# Patient Record
Sex: Female | Born: 1955 | Race: Black or African American | Hispanic: No | Marital: Married | Smoking: Never smoker
Health system: Southern US, Community
[De-identification: ages and names within clinical notes are randomized; demographics above are authoritative.]

## PROBLEM LIST (undated history)

## (undated) DIAGNOSIS — Z87442 Personal history of urinary calculi: Secondary | ICD-10-CM

## (undated) DIAGNOSIS — K429 Umbilical hernia without obstruction or gangrene: Secondary | ICD-10-CM

## (undated) DIAGNOSIS — K5790 Diverticulosis of intestine, part unspecified, without perforation or abscess without bleeding: Secondary | ICD-10-CM

## (undated) DIAGNOSIS — N200 Calculus of kidney: Secondary | ICD-10-CM

## (undated) DIAGNOSIS — F329 Major depressive disorder, single episode, unspecified: Secondary | ICD-10-CM

## (undated) DIAGNOSIS — H269 Unspecified cataract: Secondary | ICD-10-CM

## (undated) DIAGNOSIS — H3321 Serous retinal detachment, right eye: Secondary | ICD-10-CM

## (undated) DIAGNOSIS — H401132 Primary open-angle glaucoma, bilateral, moderate stage: Secondary | ICD-10-CM

## (undated) DIAGNOSIS — H409 Unspecified glaucoma: Secondary | ICD-10-CM

## (undated) DIAGNOSIS — R002 Palpitations: Secondary | ICD-10-CM

## (undated) DIAGNOSIS — H35341 Macular cyst, hole, or pseudohole, right eye: Secondary | ICD-10-CM

## (undated) DIAGNOSIS — I10 Essential (primary) hypertension: Secondary | ICD-10-CM

## (undated) DIAGNOSIS — F32A Depression, unspecified: Secondary | ICD-10-CM

## (undated) DIAGNOSIS — R55 Syncope and collapse: Secondary | ICD-10-CM

## (undated) HISTORY — PX: KNEE ARTHROSCOPY: SUR90

## (undated) HISTORY — PX: EYE SURGERY: SHX253

## (undated) HISTORY — PX: COLONOSCOPY: SHX174

## (undated) HISTORY — PX: TUBAL LIGATION: SHX77

## (undated) HISTORY — PX: CATARACT EXTRACTION W/ INTRAOCULAR LENS IMPLANT: SHX1309

## (undated) HISTORY — PX: DILATION AND CURETTAGE OF UTERUS: SHX78

## (undated) HISTORY — PX: CRYOABLATION: SHX1415

## (undated) HISTORY — DX: Calculus of kidney: N20.0

## (undated) HISTORY — PX: SHOULDER ARTHROSCOPY W/ ROTATOR CUFF REPAIR: SHX2400

---

## 2004-02-13 ENCOUNTER — Ambulatory Visit: Payer: Self-pay | Admitting: Family Medicine

## 2004-02-23 ENCOUNTER — Ambulatory Visit: Payer: Self-pay | Admitting: Unknown Physician Specialty

## 2004-09-30 ENCOUNTER — Other Ambulatory Visit: Admission: RE | Admit: 2004-09-30 | Discharge: 2004-09-30 | Payer: Self-pay | Admitting: Gynecology

## 2005-08-26 ENCOUNTER — Ambulatory Visit: Payer: Self-pay | Admitting: Unknown Physician Specialty

## 2005-10-01 ENCOUNTER — Other Ambulatory Visit: Admission: RE | Admit: 2005-10-01 | Discharge: 2005-10-01 | Payer: Self-pay | Admitting: Gynecology

## 2006-02-02 ENCOUNTER — Emergency Department (HOSPITAL_COMMUNITY): Admission: EM | Admit: 2006-02-02 | Discharge: 2006-02-02 | Payer: Self-pay | Admitting: Emergency Medicine

## 2006-11-10 ENCOUNTER — Other Ambulatory Visit: Admission: RE | Admit: 2006-11-10 | Discharge: 2006-11-10 | Payer: Self-pay | Admitting: Gynecology

## 2006-12-30 ENCOUNTER — Ambulatory Visit: Payer: Self-pay | Admitting: Unknown Physician Specialty

## 2007-09-07 ENCOUNTER — Ambulatory Visit: Payer: Self-pay | Admitting: Unknown Physician Specialty

## 2007-11-16 ENCOUNTER — Encounter: Admission: RE | Admit: 2007-11-16 | Discharge: 2007-11-16 | Payer: Self-pay | Admitting: Internal Medicine

## 2007-12-16 ENCOUNTER — Encounter: Admission: RE | Admit: 2007-12-16 | Discharge: 2008-03-02 | Payer: Self-pay | Admitting: Internal Medicine

## 2007-12-31 ENCOUNTER — Ambulatory Visit: Payer: Self-pay | Admitting: Unknown Physician Specialty

## 2008-03-03 HISTORY — PX: ESOPHAGOGASTRODUODENOSCOPY: SHX1529

## 2008-03-03 HISTORY — PX: SHOULDER ARTHROSCOPY W/ ROTATOR CUFF REPAIR: SHX2400

## 2008-03-17 ENCOUNTER — Ambulatory Visit: Payer: Self-pay | Admitting: Unknown Physician Specialty

## 2008-03-22 ENCOUNTER — Ambulatory Visit: Payer: Self-pay | Admitting: Unknown Physician Specialty

## 2008-05-24 ENCOUNTER — Ambulatory Visit: Payer: Self-pay | Admitting: Unknown Physician Specialty

## 2009-01-24 ENCOUNTER — Ambulatory Visit: Payer: Self-pay | Admitting: Unknown Physician Specialty

## 2010-03-03 HISTORY — PX: TUBAL LIGATION: SHX77

## 2010-05-22 ENCOUNTER — Observation Stay: Payer: Self-pay | Admitting: *Deleted

## 2011-04-03 ENCOUNTER — Ambulatory Visit: Payer: Self-pay | Admitting: Unknown Physician Specialty

## 2011-06-13 ENCOUNTER — Ambulatory Visit: Payer: Self-pay | Admitting: Unknown Physician Specialty

## 2011-06-30 ENCOUNTER — Ambulatory Visit: Payer: Self-pay | Admitting: Internal Medicine

## 2011-09-22 ENCOUNTER — Other Ambulatory Visit (HOSPITAL_COMMUNITY)
Admission: RE | Admit: 2011-09-22 | Discharge: 2011-09-22 | Disposition: A | Discharge status: Home / Self Care | Payer: PRIVATE HEALTH INSURANCE | Source: Ambulatory Visit | Attending: Obstetrics and Gynecology | Admitting: Obstetrics and Gynecology

## 2011-09-22 ENCOUNTER — Other Ambulatory Visit: Payer: Self-pay | Admitting: Obstetrics and Gynecology

## 2011-09-22 DIAGNOSIS — Z01419 Encounter for gynecological examination (general) (routine) without abnormal findings: Secondary | ICD-10-CM | POA: Insufficient documentation

## 2011-09-22 DIAGNOSIS — N760 Acute vaginitis: Secondary | ICD-10-CM | POA: Insufficient documentation

## 2012-09-28 ENCOUNTER — Other Ambulatory Visit: Payer: Self-pay | Admitting: Obstetrics and Gynecology

## 2012-09-28 ENCOUNTER — Other Ambulatory Visit (HOSPITAL_COMMUNITY)
Admission: RE | Admit: 2012-09-28 | Discharge: 2012-09-28 | Disposition: A | Discharge status: Home / Self Care | Payer: BC Managed Care – PPO | Source: Ambulatory Visit | Attending: Obstetrics and Gynecology | Admitting: Obstetrics and Gynecology

## 2012-09-28 DIAGNOSIS — Z01419 Encounter for gynecological examination (general) (routine) without abnormal findings: Secondary | ICD-10-CM | POA: Insufficient documentation

## 2012-09-28 DIAGNOSIS — N644 Mastodynia: Secondary | ICD-10-CM

## 2012-09-28 DIAGNOSIS — Z1151 Encounter for screening for human papillomavirus (HPV): Secondary | ICD-10-CM | POA: Insufficient documentation

## 2012-10-12 ENCOUNTER — Other Ambulatory Visit: Payer: Self-pay | Admitting: Obstetrics and Gynecology

## 2012-10-13 ENCOUNTER — Other Ambulatory Visit: Payer: PRIVATE HEALTH INSURANCE

## 2012-10-13 ENCOUNTER — Ambulatory Visit: Payer: Self-pay

## 2012-12-02 ENCOUNTER — Ambulatory Visit: Payer: Self-pay | Admitting: Urology

## 2013-03-03 HISTORY — PX: RETINAL DETACHMENT SURGERY: SHX105

## 2013-04-10 ENCOUNTER — Other Ambulatory Visit: Payer: Self-pay | Admitting: Ophthalmology

## 2013-04-10 MED ORDER — TETRACAINE HCL 0.5 % OP SOLN: 1.0000 [drp] | OPHTHALMIC | Status: DC

## 2013-04-10 NOTE — H&P (Signed)
History & Physical:   DATE:   04-07-13  NAME:  Zoe [name], Zoe [name]     [id number]       HISTORY OF PRESENT ILLNESS: Chief Eye Complaints   Glaucoma  patient  : Patient is having trouble seeing @ Dist vision is cloudy and blurry   patient notes difficulty with car lights while driving at night in particular.,Patient states that she is ready for Cat, Surgery    HPI: EYES: Reports symptoms of OD cloudy and Blurry             ACTIVE PROBLEMS: Visually significant cataract, right eye Primary open angle glaucoma   ICD#365.11  Onset:   Initial Date:    treatment.  Visual Fields  progression noted OD, stable OS  Nuclear cataract NOS   ICD#366.04  Onset:   Initial Date:  SURGERIES:   SLT 06-23-2012 OD nasal SLT 09-09-12 OD TEMP  MEDICATIONS: Travatan Z: Strength-  SIG-  1 gtt in each affected eye once a day (in the evening) for 30 days  Combigan: 0.2%-0.5% solution SIG-  1 gtt in each affected eye every 12 hours for 30 days 1 drop eqch eye twice daily   6 am  REVIEW OF SYSTEMS: ROS:   GEN- Constitutional: negative except as noted HENT: poor vision  GEN - Endocrine: Reports symptoms of LUNGS/Respiratory:  HEART/Cardiovascular: Reports symptoms of hypertension ABD/Gastrointestinal:   Musculoskeletal (BJE): NEURO/Neurological: PSYCH/Psychiatric:    Is the pt oriented to time, place, person? yes  Mood normal    TOBACCO:  Never smoker   ICD#V13.89    SOCIAL HISTORY  works [employer]   FAMILY HISTORY: Positive family history for  -   Glaucoma:  Glaucoma/Diabetes/HTN Family History - 1st Degree Relatives:  Mother alive and well.  Father alive and well.  ALLERGIES:  Drug Allergies.  No Known.   PHYSICAL EXAMINATION: VS: BMI: 23.7.  BP: 145/83.  H: 65.00 in.  P: 66 /min.  RR: 20 /min.  W: 142lbs 0oz.    Va     OD:cc 20/40+ PH:20/NI OS:cc 20/20-  EYEGLASSES:  OD:-7.50 + 1.00 x 018                                          OS:-4.00 + 0.75 x  062 ADD:+2.00  MR02/07/2013 12:22    OD -7.25 +0.50 x 024    20/40  OS  -4.00 +0.75 x 024    20/25+ ADD  K's02/07/2013 11:54  OD:43.25 44.50 44.00 OS:43.25 45.25 44.25   VF:  OD:inf arcuate progressed                                            OS: arcuate defect   Motility :orthophoria and full  PUPILS: 3 mm round reactive negative Marcus Gunn  EYELIDS & OCULAR ADNEXA :normal each eye  SLE: Conjunctiva:trace injection each eye  Cornea: decrease tear film each eye and arcus   Anterior Chamber:  deep and quiet each eye  Iris:Brown each eye  Lens: OD:+ 3 nuclear sclerosis  OS:1-2 nuclear sclerosis   Ta   in mmHg    OD: 19       OS:20  Time:04/07/2013 12:54   Dilation:  phenylephrine 2.5%    Fundus: optic nerve   OD:75-80%   cup pink color                                               OS:pink color 55-60% cup Macula:       OD:   CLEAR                                                  OS:  CLEAR Vessels:  normal    Periphery:  normal    Ocular coherent tomography OD reveals a very thin diffusely nerve fiber layer, left eye also reveals a thin nerve fiber layer but low signal strength OS decreases reliability OS    Exam: GENERAL: Appearance: General appearance can be described as well-nourished, well-developed, and in no acute distress.    HEAD, EARS, NOSE AND THROAT: Ears-Nose (external) Inspection: Externally, nose and ears are normal in appearance and without scars, lesions, or nodules.      Hearing assessment shows no problems with normal conversation.     NECK: Neck tissue exam demonstrates no masses, symmetrical, and trachea is midline.      LUNGS and RESPIRATORY: Lung auscultation elicits no wheezing, rhonci, rales or rubs and with equal breath sounds.    Respiratory effort described as breathing is unlabored and chest movement is symmetrical.    HEART (Cardiovascular): Heart auscultation discovers regular rate and rhythm; no murmur, gallop or rub. Normal  heart sounds.    ABDOMEN (Gastrointestinal): Mass/Tenderness Exam: Neither are present.     MUSCULOSKELETAL (BJE): Inspection-Palpation: No major bone, joint, tendon, or muscle changes.      NEUROLOGICAL: Alert and oriented. No major deficits of coordination or sensation.      PSYCHIATRIC: Insight and judgment appear  both to be intact and appropriate.    Mood and affect are described as normal mood and full affect.    SKIN: Skin Inspection: No rashes or lesions  ADMITTING DIAGNOSIS: Visually significant cataract, right eye Primary open angle glaucoma   ICD#365.11  Onset:    treatment.  Visual Fields  progression noted OD, stable OS  Nuclear cataract NOS   ICD#366.04  Onset:  SURGICAL TREATMENT PLAN: phaco emulsion cataract extraction  emulsification with intraocular lens implant OD  Risk and benefits of surgery have been reviewed with the patient and the patient agrees to proceed with the surgical procedure.   We discussed astigmatism we discussed glaucoma control before and after cataract surgery.  The patient's glaucoma is not adequately controlled.  She understands that she might require glaucoma surgery.  The current operation will be for cataract surgery and visual improvement.    ___________________________ Vang Kraeger, Jr. Starter - Inactive Problems:  

## 2013-04-10 NOTE — H&P (Signed)
History & Physical:   DATE:   04-07-13  NAME:  Zoe [name], Zoe [name]     [id number]       HISTORY OF PRESENT ILLNESS: Chief Eye Complaints   Glaucoma  patient  : Patient is having trouble seeing @ Dist vision is cloudy and blurry   patient notes difficulty with car lights while driving at night in particular.,Patient states that she is ready for Cat, Surgery    HPI: EYES: Reports symptoms of OD cloudy and Blurry             ACTIVE PROBLEMS: Visually significant cataract, right eye Primary open angle glaucoma   ICD#365.11  Onset:   Initial Date:    treatment.  Visual Fields  progression noted OD, stable OS  Nuclear cataract NOS   ICD#366.04  Onset:   Initial Date:  SURGERIES:   SLT 06-23-2012 OD nasal SLT 09-09-12 OD TEMP  MEDICATIONS: Travatan Z: Strength-  SIG-  1 gtt in each affected eye once a day (in the evening) for 30 days  Combigan: 0.2%-0.5% solution SIG-  1 gtt in each affected eye every 12 hours for 30 days 1 drop eqch eye twice daily   6 am  REVIEW OF SYSTEMS: ROS:   GEN- Constitutional: negative except as noted HENT: poor vision  GEN - Endocrine: Reports symptoms of LUNGS/Respiratory:  HEART/Cardiovascular: Reports symptoms of hypertension ABD/Gastrointestinal:   Musculoskeletal (BJE): NEURO/Neurological: PSYCH/Psychiatric:    Is the pt oriented to time, place, person? yes  Mood normal    TOBACCO:  Never smoker   ICD#V13.89    SOCIAL HISTORY  works [employer]   FAMILY HISTORY: Positive family history for  -   Glaucoma:  Glaucoma/Diabetes/HTN Family History - 1st Degree Relatives:  Mother alive and well.  Father alive and well.  ALLERGIES:  Drug Allergies.  No Known.   PHYSICAL EXAMINATION: VS: BMI: 23.7.  BP: 145/83.  H: 65.00 in.  P: 66 /min.  RR: 20 /min.  W: 142lbs 0oz.    Va     OD:cc 20/40+ PH:20/NI OS:cc 20/20-  EYEGLASSES:  OD:-7.50 + 1.00 x 018                                          OS:-4.00 + 0.75 x  062 ADD:+2.00  MR02/07/2013 12:22    OD -7.25 +0.50 x 024    20/40  OS  -4.00 +0.75 x 024    20/25+ ADD  K's02/07/2013 11:54  OD:43.25 44.50 44.00 OS:43.25 45.25 44.25   VF:  OD:inf arcuate progressed                                            OS: arcuate defect   Motility :orthophoria and full  PUPILS: 3 mm round reactive negative Marcus Gunn  EYELIDS & OCULAR ADNEXA :normal each eye  SLE: Conjunctiva:trace injection each eye  Cornea: decrease tear film each eye and arcus   Anterior Chamber:  deep and quiet each eye  Iris:Brown each eye  Lens: OD:+ 3 nuclear sclerosis  OS:1-2 nuclear sclerosis   Ta   in mmHg    OD: 19       OS:20  Time:04/07/2013 12:54   Dilation:  phenylephrine 2.5%    Fundus: optic nerve   OD:75-80%  cup pink color                                               OS:pink color 55-60% cup Macula:       OD:   CLEAR                                                  OS:  CLEAR Vessels:  normal    Periphery:  normal    Ocular coherent tomography OD reveals a very thin diffusely nerve fiber layer, left eye also reveals a thin nerve fiber layer but low signal strength OS decreases reliability OS    Exam: GENERAL: Appearance: General appearance can be described as well-nourished, well-developed, and in no acute distress.    HEAD, EARS, NOSE AND THROAT: Ears-Nose (external) Inspection: Externally, nose and ears are normal in appearance and without scars, lesions, or nodules.      Hearing assessment shows no problems with normal conversation.     NECK: Neck tissue exam demonstrates no masses, symmetrical, and trachea is midline.      LUNGS and RESPIRATORY: Lung auscultation elicits no wheezing, rhonci, rales or rubs and with equal breath sounds.    Respiratory effort described as breathing is unlabored and chest movement is symmetrical.    HEART (Cardiovascular): Heart auscultation discovers regular rate and rhythm; no murmur, gallop or rub. Normal  heart sounds.    ABDOMEN (Gastrointestinal): Mass/Tenderness Exam: Neither are present.     MUSCULOSKELETAL (BJE): Inspection-Palpation: No major bone, joint, tendon, or muscle changes.      NEUROLOGICAL: Alert and oriented. No major deficits of coordination or sensation.      PSYCHIATRIC: Insight and judgment appear  both to be intact and appropriate.    Mood and affect are described as normal mood and full affect.    SKIN: Skin Inspection: No rashes or lesions  ADMITTING DIAGNOSIS: Visually significant cataract, right eye Primary open angle glaucoma   ICD#365.11  Onset:    treatment.  Visual Fields  progression noted OD, stable OS  Nuclear cataract NOS   ICD#366.04  Onset:  SURGICAL TREATMENT PLAN: phaco emulsion cataract extraction  emulsification with intraocular lens implant OD  Risk and benefits of surgery have been reviewed with the patient and the patient agrees to proceed with the surgical procedure.   We discussed astigmatism we discussed glaucoma control before and after cataract surgery.  The patient's glaucoma is not adequately controlled.  She understands that she might require glaucoma surgery.  The current operation will be for cataract surgery and visual improvement.    ___________________________ Nakeitha Milligan, Jr. Starter - Inactive Problems:  

## 2013-04-10 NOTE — H&P (Signed)
History & Physical:   DATE:   04-07-13  NAME:  Zoe [name]     [id number]       HISTORY OF PRESENT ILLNESS: Chief Eye Complaints   Glaucoma  patient  : Patient is having trouble seeing @ Dist vision is cloudy and blurry   patient notes difficulty with car lights while driving at night in particular.,Patient states that she is ready for Cat, Surgery    HPI: EYES: Reports symptoms of OD cloudy and Blurry             ACTIVE PROBLEMS: Visually significant cataract, right eye Primary open angle glaucoma   ICD#365.11  Onset:   Initial Date:    treatment.  Visual Fields  progression noted OD, stable OS  Nuclear cataract NOS   ICD#366.04  Onset:   Initial Date:  SURGERIES:   SLT 06-23-2012 OD nasal SLT 09-09-12 OD TEMP  MEDICATIONS: Travatan Z: Strength-  SIG-  1 gtt in each affected eye once a day (in the evening) for 30 days  Combigan: 0.2%-0.5% solution SIG-  1 gtt in each affected eye every 12 hours for 30 days 1 drop eqch eye twice daily   6 am  REVIEW OF SYSTEMS: ROS:   GEN- Constitutional: negative except as noted HENT: poor vision  GEN - Endocrine: Reports symptoms of LUNGS/Respiratory:  HEART/Cardiovascular: Reports symptoms of hypertension ABD/Gastrointestinal:   Musculoskeletal (BJE): NEURO/Neurological: PSYCH/Psychiatric:    Is the pt oriented to time, place, person? yes  Mood normal    TOBACCO:  Never smoker   ICD#V13.89    SOCIAL HISTORY  works [employer]   FAMILY HISTORY: Positive family history for  -   Glaucoma:  Glaucoma/Diabetes/HTN Family History - 1st Degree Relatives:  Mother alive and well.  Father alive and well.  ALLERGIES:  Drug Allergies.  No Known.   PHYSICAL EXAMINATION: VS: BMI: 23.7.  BP: 145/83.  H: 65.00 in.  P: 66 /min.  RR: 20 /min.  W: 142lbs 0oz.    Va     OD:cc 20/40+ PH:20/NI OS:cc 20/20-  EYEGLASSES:  OD:-7.50 + 1.00 x 018                                          OS:-4.00 + 0.75 x  062 ADD:+2.00  MR02/07/2013 12:22    OD -7.25 +0.50 x 024    20/40  OS  -4.00 +0.75 x 024    20/25+ ADD  K's02/07/2013 11:54  OD:43.25 44.50 44.00 OS:43.25 45.25 44.25   VF:  OD:inf arcuate progressed                                            OS: arcuate defect   Motility :orthophoria and full  PUPILS: 3 mm round reactive negative Marcus Gunn  EYELIDS & OCULAR ADNEXA :normal each eye  SLE: Conjunctiva:trace injection each eye  Cornea: decrease tear film each eye and arcus   Anterior Chamber:  deep and quiet each eye  Iris:Brown each eye  Lens: OD:+ 3 nuclear sclerosis  OS:1-2 nuclear sclerosis   Ta   in mmHg    OD: 19       OS:20  Time:04/07/2013 12:54   Dilation:  phenylephrine 2.5%    Fundus: optic nerve   OD:75-80%   cup pink color                                               OS:pink color 55-60% cup Macula:       OD:   CLEAR                                                  OS:  CLEAR Vessels:  normal    Periphery:  normal    Ocular coherent tomography OD reveals a very thin diffusely nerve fiber layer, left eye also reveals a thin nerve fiber layer but low signal strength OS decreases reliability OS    Exam: GENERAL: Appearance: General appearance can be described as well-nourished, well-developed, and in no acute distress.    HEAD, EARS, NOSE AND THROAT: Ears-Nose (external) Inspection: Externally, nose and ears are normal in appearance and without scars, lesions, or nodules.      Hearing assessment shows no problems with normal conversation.     NECK: Neck tissue exam demonstrates no masses, symmetrical, and trachea is midline.      LUNGS and RESPIRATORY: Lung auscultation elicits no wheezing, rhonci, rales or rubs and with equal breath sounds.    Respiratory effort described as breathing is unlabored and chest movement is symmetrical.    HEART (Cardiovascular): Heart auscultation discovers regular rate and rhythm; no murmur, gallop or rub. Normal  heart sounds.    ABDOMEN (Gastrointestinal): Mass/Tenderness Exam: Neither are present.     MUSCULOSKELETAL (BJE): Inspection-Palpation: No major bone, joint, tendon, or muscle changes.      NEUROLOGICAL: Alert and oriented. No major deficits of coordination or sensation.      PSYCHIATRIC: Insight and judgment appear  both to be intact and appropriate.    Mood and affect are described as normal mood and full affect.    SKIN: Skin Inspection: No rashes or lesions  ADMITTING DIAGNOSIS: Visually significant cataract, right eye Primary open angle glaucoma   ICD#365.11  Onset:    treatment.  Visual Fields  progression noted OD, stable OS  Nuclear cataract NOS   ICD#366.04  Onset:  SURGICAL TREATMENT PLAN: phaco emulsion cataract extraction  emulsification with intraocular lens implant OD  Risk and benefits of surgery have been reviewed with the patient and the patient agrees to proceed with the surgical procedure.   We discussed astigmatism we discussed glaucoma control before and after cataract surgery.  The patient's glaucoma is not adequately controlled.  She understands that she might require glaucoma surgery.  The current operation will be for cataract surgery and visual improvement.    ___________________________ Bryn Saline, Jr. Starter - Inactive Problems:  

## 2013-04-11 ENCOUNTER — Encounter (HOSPITAL_COMMUNITY): Payer: Self-pay | Admitting: Pharmacy Technician

## 2013-04-12 ENCOUNTER — Inpatient Hospital Stay (HOSPITAL_COMMUNITY): Admission: RE | Admit: 2013-04-12 | Payer: PRIVATE HEALTH INSURANCE | Source: Ambulatory Visit

## 2013-04-12 ENCOUNTER — Encounter (HOSPITAL_COMMUNITY): Payer: Self-pay

## 2013-04-12 ENCOUNTER — Encounter (HOSPITAL_COMMUNITY)
Admission: RE | Admit: 2013-04-12 | Discharge: 2013-04-12 | Disposition: A | Discharge status: Home / Self Care | Payer: BC Managed Care – PPO | Source: Ambulatory Visit | Attending: Ophthalmology | Admitting: Ophthalmology

## 2013-04-12 ENCOUNTER — Ambulatory Visit (HOSPITAL_COMMUNITY)
Admission: RE | Admit: 2013-04-12 | Discharge: 2013-04-12 | Disposition: A | Discharge status: Home / Self Care | Payer: BC Managed Care – PPO | Source: Ambulatory Visit | Attending: Anesthesiology | Admitting: Anesthesiology

## 2013-04-12 HISTORY — DX: Depression, unspecified: F32.A

## 2013-04-12 HISTORY — DX: Calculus of kidney: N20.0

## 2013-04-12 HISTORY — DX: Unspecified glaucoma: H40.9

## 2013-04-12 HISTORY — DX: Unspecified cataract: H26.9

## 2013-04-12 HISTORY — DX: Major depressive disorder, single episode, unspecified: F32.9

## 2013-04-12 HISTORY — DX: Umbilical hernia without obstruction or gangrene: K42.9

## 2013-04-12 HISTORY — DX: Essential (primary) hypertension: I10

## 2013-04-12 HISTORY — DX: Syncope and collapse: R55

## 2013-04-12 LAB — CBC
HEMATOCRIT: 37.6 % (ref 36.0–46.0)
Hemoglobin: 12.5 g/dL (ref 12.0–15.0)
MCH: 30.3 pg (ref 26.0–34.0)
MCHC: 33.2 g/dL (ref 30.0–36.0)
MCV: 91 fL (ref 78.0–100.0)
Platelets: 239 10*3/uL (ref 150–400)
RBC: 4.13 MIL/uL (ref 3.87–5.11)
RDW: 12.4 % (ref 11.5–15.5)
WBC: 7.4 10*3/uL (ref 4.0–10.5)

## 2013-04-12 LAB — BASIC METABOLIC PANEL
BUN: 15 mg/dL (ref 6–23)
CHLORIDE: 107 meq/L (ref 96–112)
CO2: 28 meq/L (ref 19–32)
Calcium: 8.7 mg/dL (ref 8.4–10.5)
Creatinine, Ser: 0.93 mg/dL (ref 0.50–1.10)
GFR calc non Af Amer: 67 mL/min — ABNORMAL LOW (ref >90)
GFR, EST AFRICAN AMERICAN: 78 mL/min — AB (ref >90)
Glucose, Bld: 117 mg/dL — ABNORMAL HIGH (ref 70–99)
POTASSIUM: 3.8 meq/L (ref 3.7–5.3)
SODIUM: 145 meq/L (ref 137–147)

## 2013-04-12 MED ORDER — GATIFLOXACIN 0.5 % OP SOLN: 1.0000 [drp] | OPHTHALMIC | Status: AC

## 2013-04-12 MED ORDER — CYCLOPENTOLATE HCL 1 % OP SOLN
1.0000 [drp] | OPHTHALMIC | Status: AC
  Administered 2013-04-13 (×3): 1 [drp] via OPHTHALMIC
  Filled 2013-04-12: qty 2

## 2013-04-12 MED ORDER — PHENYLEPHRINE HCL 2.5 % OP SOLN
1.0000 [drp] | OPHTHALMIC | Status: AC
  Administered 2013-04-13 (×3): 1 [drp] via OPHTHALMIC
  Filled 2013-04-12: qty 15

## 2013-04-12 MED ORDER — KETOROLAC TROMETHAMINE 0.5 % OP SOLN
1.0000 [drp] | OPHTHALMIC | Status: AC
  Administered 2013-04-13: 1 [drp] via OPHTHALMIC
  Filled 2013-04-12: qty 5

## 2013-04-12 MED ORDER — TROPICAMIDE 1 % OP SOLN
1.0000 [drp] | OPHTHALMIC | Status: AC
  Administered 2013-04-13 (×3): 1 [drp] via OPHTHALMIC
  Filled 2013-04-12: qty 3

## 2013-04-12 NOTE — Pre-Procedure Instructions (Signed)
Zoe [name]  04/12/2013   Your procedure is scheduled on:  Wednesday, April 13, 2013 at 8:30 AM  Report to [facility] Short Stay (use Main Entrance "A'') at 6:30 AM.  Call this number if you have problems the morning of surgery: [phone]   Remember:   Do not eat food or drink liquids after midnight.   Take these medicines the morning of surgery with A SIP OF WATER: If needed:acetaminophen (TYLENOL) 500 MG tablet for mild pain   Do not wear jewelry, make-up or nail polish.  Do not wear lotions, powders, or perfumes. You may wear deodorant.  Do not shave 48 hours prior to surgery. Men may shave face and neck.  Do not bring valuables to the hospital.  [facility] is not responsible for any belongings or valuables.               Contacts, dentures or bridgework may not be worn into surgery.  Leave suitcase in the car. After surgery it may be brought to your room.  For patients admitted to the hospital, discharge time is determined by your treatment team.               Patients discharged the day of surgery will not be allowed to drive home.  Name and phone number of your driver:   Special Instructions:  Special Instructions:Special Instructions: [facility] - Preparing for Surgery  Before surgery, you can play an important role.  Because skin is not sterile, your skin needs to be as free of germs as possible.  You can reduce the number of germs on you skin by washing with CHG (chlorahexidine gluconate) soap before surgery.  CHG is an antiseptic cleaner which kills germs and bonds with the skin to continue killing germs even after washing.  Please DO NOT use if you have an allergy to CHG or antibacterial soaps.  If your skin becomes reddened/irritated stop using the CHG and inform your nurse when you arrive at Short Stay.  Do not shave (including legs and underarms) for at least 48 hours prior to the first CHG shower.  You may shave your face.  Please follow these instructions  carefully:   1.  Shower with CHG Soap the night before surgery and the morning of Surgery.  2.  If you choose to wash your hair, wash your hair first as usual with your normal shampoo.  3.  After you shampoo, rinse your hair and body thoroughly to remove the Shampoo.  4.  Use CHG as you would any other liquid soap.  You can apply chg directly  to the skin and wash gently with scrungie or a clean washcloth.  5.  Apply the CHG Soap to your body ONLY FROM THE NECK DOWN.  Do not use on open wounds or open sores.  Avoid contact with your eyes, ears, mouth and genitals (private parts).  Wash genitals (private parts) with your normal soap.  6.  Wash thoroughly, paying special attention to the area where your surgery will be performed.  7.  Thoroughly rinse your body with warm water from the neck down.  8.  DO NOT shower/wash with your normal soap after using and rinsing off the CHG Soap.  9.  Pat yourself dry with a clean towel.            10.  Wear clean pajamas.            11.  Place clean sheets on your bed  the night of your first shower and do not sleep with pets.  Day of Surgery  Do not apply any lotions/deodorants the morning of surgery.  Please wear clean clothes to the hospital/surgery center.   Please read over the following fact sheets that you were given: Pain Booklet and Surgical Site Infection Prevention

## 2013-04-13 ENCOUNTER — Ambulatory Visit (HOSPITAL_COMMUNITY): Payer: BC Managed Care – PPO | Admitting: Certified Registered Nurse Anesthetist

## 2013-04-13 ENCOUNTER — Encounter (HOSPITAL_COMMUNITY): Payer: BC Managed Care – PPO | Admitting: Certified Registered Nurse Anesthetist

## 2013-04-13 ENCOUNTER — Encounter (HOSPITAL_COMMUNITY)
Admission: RE | Disposition: A | Discharge status: Home / Self Care | Payer: Self-pay | Source: Ambulatory Visit | Attending: Ophthalmology

## 2013-04-13 ENCOUNTER — Encounter (HOSPITAL_COMMUNITY): Payer: Self-pay | Admitting: *Deleted

## 2013-04-13 ENCOUNTER — Ambulatory Visit (HOSPITAL_COMMUNITY)
Admission: RE | Admit: 2013-04-13 | Discharge: 2013-04-13 | Disposition: A | Discharge status: Home / Self Care | Payer: BC Managed Care – PPO | Source: Ambulatory Visit | Attending: Ophthalmology | Admitting: Ophthalmology

## 2013-04-13 DIAGNOSIS — F329 Major depressive disorder, single episode, unspecified: Secondary | ICD-10-CM | POA: Insufficient documentation

## 2013-04-13 DIAGNOSIS — Z01818 Encounter for other preprocedural examination: Secondary | ICD-10-CM | POA: Insufficient documentation

## 2013-04-13 DIAGNOSIS — F3289 Other specified depressive episodes: Secondary | ICD-10-CM | POA: Insufficient documentation

## 2013-04-13 DIAGNOSIS — H409 Unspecified glaucoma: Secondary | ICD-10-CM | POA: Insufficient documentation

## 2013-04-13 DIAGNOSIS — H269 Unspecified cataract: Secondary | ICD-10-CM | POA: Insufficient documentation

## 2013-04-13 DIAGNOSIS — I10 Essential (primary) hypertension: Secondary | ICD-10-CM | POA: Insufficient documentation

## 2013-04-13 DIAGNOSIS — Z01810 Encounter for preprocedural cardiovascular examination: Secondary | ICD-10-CM | POA: Insufficient documentation

## 2013-04-13 DIAGNOSIS — H4011X0 Primary open-angle glaucoma, stage unspecified: Secondary | ICD-10-CM | POA: Insufficient documentation

## 2013-04-13 DIAGNOSIS — Z01812 Encounter for preprocedural laboratory examination: Secondary | ICD-10-CM | POA: Insufficient documentation

## 2013-04-13 HISTORY — PX: CATARACT EXTRACTION W/PHACO: SHX586

## 2013-04-13 SURGERY — PHACOEMULSIFICATION, CATARACT, WITH IOL INSERTION
Anesthesia: Monitor Anesthesia Care | Site: Eye | Laterality: Right

## 2013-04-13 MED ORDER — DEXAMETHASONE SODIUM PHOSPHATE 10 MG/ML IJ SOLN
INTRAMUSCULAR | Status: AC
  Filled 2013-04-13: qty 1

## 2013-04-13 MED ORDER — ARTIFICIAL TEARS OP OINT
TOPICAL_OINTMENT | OPHTHALMIC | Status: DC | PRN
  Administered 2013-04-13: 1 via OPHTHALMIC

## 2013-04-13 MED ORDER — LIDOCAINE HCL 2 % IJ SOLN
INTRAMUSCULAR | Status: AC
  Filled 2013-04-13: qty 20

## 2013-04-13 MED ORDER — EPINEPHRINE HCL 1 MG/ML IJ SOLN
INTRAMUSCULAR | Status: AC
  Filled 2013-04-13: qty 1

## 2013-04-13 MED ORDER — MIDAZOLAM HCL 2 MG/2ML IJ SOLN
INTRAMUSCULAR | Status: AC
  Filled 2013-04-13: qty 2

## 2013-04-13 MED ORDER — LIDOCAINE-EPINEPHRINE 2 %-1:100000 IJ SOLN
INTRAMUSCULAR | Status: DC | PRN
Start: 2013-04-13 — End: 2013-04-13
  Administered 2013-04-13: 09:00:00 via RETROBULBAR

## 2013-04-13 MED ORDER — NA CHONDROIT SULF-NA HYALURON 40-30 MG/ML IO SOLN
INTRAOCULAR | Status: DC | PRN
  Administered 2013-04-13: 0.5 mL via INTRAOCULAR

## 2013-04-13 MED ORDER — ACETYLCHOLINE CHLORIDE 1:100 IO SOLR
INTRAOCULAR | Status: AC
  Filled 2013-04-13: qty 1

## 2013-04-13 MED ORDER — SODIUM CHLORIDE 0.9 % IV SOLN
INTRAVENOUS | Status: DC | PRN
  Administered 2013-04-13 (×2): via INTRAVENOUS

## 2013-04-13 MED ORDER — BUPIVACAINE HCL (PF) 0.75 % IJ SOLN
INTRAMUSCULAR | Status: AC
  Filled 2013-04-13: qty 10

## 2013-04-13 MED ORDER — FENTANYL CITRATE 0.05 MG/ML IJ SOLN
INTRAMUSCULAR | Status: AC
  Filled 2013-04-13: qty 5

## 2013-04-13 MED ORDER — LIDOCAINE-EPINEPHRINE 2 %-1:100000 IJ SOLN
INTRAMUSCULAR | Status: AC
  Filled 2013-04-13: qty 1

## 2013-04-13 MED ORDER — GENTAMICIN SULFATE 40 MG/ML IJ SOLN
INTRAMUSCULAR | Status: AC
  Filled 2013-04-13: qty 2

## 2013-04-13 MED ORDER — PROPOFOL 10 MG/ML IV BOLUS
INTRAVENOUS | Status: AC
  Filled 2013-04-13: qty 20

## 2013-04-13 MED ORDER — BSS IO SOLN
INTRAOCULAR | Status: AC
  Filled 2013-04-13: qty 500

## 2013-04-13 MED ORDER — NA CHONDROIT SULF-NA HYALURON 40-30 MG/ML IO SOLN
INTRAOCULAR | Status: AC
  Filled 2013-04-13: qty 0.5

## 2013-04-13 MED ORDER — MIDAZOLAM HCL 5 MG/5ML IJ SOLN
INTRAMUSCULAR | Status: DC | PRN
  Administered 2013-04-13: 2 mg via INTRAVENOUS

## 2013-04-13 MED ORDER — ONDANSETRON HCL 4 MG/2ML IJ SOLN
INTRAMUSCULAR | Status: AC
  Filled 2013-04-13: qty 2

## 2013-04-13 MED ORDER — FENTANYL CITRATE 0.05 MG/ML IJ SOLN
INTRAMUSCULAR | Status: DC | PRN
  Administered 2013-04-13 (×2): 50 ug via INTRAVENOUS

## 2013-04-13 MED ORDER — PILOCARPINE HCL 4 % OP SOLN
OPHTHALMIC | Status: AC
  Filled 2013-04-13: qty 15

## 2013-04-13 MED ORDER — BSS IO SOLN
INTRAOCULAR | Status: DC | PRN
  Administered 2013-04-13 (×2)

## 2013-04-13 MED ORDER — BSS IO SOLN
INTRAOCULAR | Status: AC
  Filled 2013-04-13: qty 15

## 2013-04-13 MED ORDER — PROPOFOL 10 MG/ML IV BOLUS
INTRAVENOUS | Status: DC | PRN
  Administered 2013-04-13 (×2): 20 mg via INTRAVENOUS

## 2013-04-13 MED ORDER — SUCCINYLCHOLINE CHLORIDE 20 MG/ML IJ SOLN
INTRAMUSCULAR | Status: AC
  Filled 2013-04-13: qty 1

## 2013-04-13 MED ORDER — HYALURONIDASE HUMAN 150 UNIT/ML IJ SOLN
INTRAMUSCULAR | Status: AC
  Filled 2013-04-13: qty 1

## 2013-04-13 MED ORDER — ROCURONIUM BROMIDE 50 MG/5ML IV SOLN
INTRAVENOUS | Status: AC
  Filled 2013-04-13: qty 1

## 2013-04-13 MED ORDER — ARTIFICIAL TEARS OP OINT
TOPICAL_OINTMENT | OPHTHALMIC | Status: AC
  Filled 2013-04-13: qty 3.5

## 2013-04-13 MED ORDER — SODIUM HYALURONATE 10 MG/ML IO SOLN
INTRAOCULAR | Status: DC | PRN
  Administered 2013-04-13 (×2): 0.85 mL via INTRAOCULAR

## 2013-04-13 MED ORDER — TOBRAMYCIN-DEXAMETHASONE 0.3-0.1 % OP OINT
TOPICAL_OINTMENT | OPHTHALMIC | Status: AC
  Filled 2013-04-13: qty 3.5

## 2013-04-13 MED ORDER — SODIUM HYALURONATE 10 MG/ML IO SOLN
INTRAOCULAR | Status: AC
  Filled 2013-04-13: qty 0.85

## 2013-04-13 MED ORDER — TOBRAMYCIN 0.3 % OP OINT
TOPICAL_OINTMENT | OPHTHALMIC | Status: DC | PRN
  Administered 2013-04-13: 1 via OPHTHALMIC

## 2013-04-13 MED ORDER — ACETYLCHOLINE CHLORIDE 1:100 IO SOLR
INTRAOCULAR | Status: DC | PRN
  Administered 2013-04-13: 10 mg via INTRAOCULAR

## 2013-04-13 MED ORDER — ONDANSETRON HCL 4 MG/2ML IJ SOLN
INTRAMUSCULAR | Status: DC | PRN
  Administered 2013-04-13: 4 mg via INTRAVENOUS

## 2013-04-13 MED ORDER — TETRACAINE HCL 0.5 % OP SOLN
OPHTHALMIC | Status: AC
  Filled 2013-04-13: qty 2

## 2013-04-13 MED ORDER — LIDOCAINE HCL (CARDIAC) 20 MG/ML IV SOLN
INTRAVENOUS | Status: AC
  Filled 2013-04-13: qty 5

## 2013-04-13 MED ORDER — SODIUM CHLORIDE 0.9 % IJ SOLN
INTRAMUSCULAR | Status: AC
  Filled 2013-04-13: qty 10

## 2013-04-13 MED ORDER — EPHEDRINE SULFATE 50 MG/ML IJ SOLN
INTRAMUSCULAR | Status: AC
  Filled 2013-04-13: qty 1

## 2013-04-13 SURGICAL SUPPLY — 48 items
APPLICATOR COTTON TIP 6IN STRL (MISCELLANEOUS) ×3 IMPLANT
APPLICATOR DR MATTHEWS STRL (MISCELLANEOUS) ×3 IMPLANT
BLADE KERATOME 2.75 (BLADE) ×2 IMPLANT
BLADE KERATOME 2.75MM (BLADE) ×1
BLADE MINI RND TIP GREEN BEAV (BLADE) IMPLANT
BLADE STAB KNIFE 45DEG (BLADE) IMPLANT
CANNULA ANTERIOR CHAMBER 27GA (MISCELLANEOUS) ×3 IMPLANT
CLOSURE WOUND 1/4X4 (GAUZE/BANDAGES/DRESSINGS) ×1
CORDS BIPOLAR (ELECTRODE) IMPLANT
COVER MAYO STAND STRL (DRAPES) ×3 IMPLANT
DRAPE OPHTHALMIC 40X48 W POUCH (DRAPES) ×3 IMPLANT
DRAPE RETRACTOR (MISCELLANEOUS) ×3 IMPLANT
FILTER BLUE MILLIPORE (MISCELLANEOUS) IMPLANT
GLOVE BIO SURGEON STRL SZ8 (GLOVE) ×3 IMPLANT
GLOVE SURG SS PI 7.0 STRL IVOR (GLOVE) ×3 IMPLANT
GLOVE SURG SS PI 7.5 STRL IVOR (GLOVE) ×3 IMPLANT
GOWN STRL REUS W/ TWL LRG LVL3 (GOWN DISPOSABLE) ×2 IMPLANT
GOWN STRL REUS W/TWL LRG LVL3 (GOWN DISPOSABLE) ×4
KIT BASIN OR (CUSTOM PROCEDURE TRAY) ×3 IMPLANT
KIT ROOM TURNOVER OR (KITS) ×3 IMPLANT
KNIFE CRESCENT 2.5 55 ANG (BLADE) IMPLANT
LENS IOL ACRSF IQ PC 16.5 (Intraocular Lens) ×1 IMPLANT
LENS IOL ACRYSOF IQ POST 16.5 (Intraocular Lens) ×3 IMPLANT
MASK EYE SHIELD (GAUZE/BANDAGES/DRESSINGS) ×3 IMPLANT
NEEDLE 18GX1X1/2 (RX/OR ONLY) (NEEDLE) ×3 IMPLANT
NEEDLE 25GX 5/8IN NON SAFETY (NEEDLE) ×3 IMPLANT
NEEDLE FILTER BLUNT 18X 1/2SAF (NEEDLE) ×2
NEEDLE FILTER BLUNT 18X1 1/2 (NEEDLE) ×1 IMPLANT
NS IRRIG 1000ML POUR BTL (IV SOLUTION) ×3 IMPLANT
PACK CATARACT CUSTOM (CUSTOM PROCEDURE TRAY) ×3 IMPLANT
PAD ARMBOARD 7.5X6 YLW CONV (MISCELLANEOUS) ×3 IMPLANT
PAD EYE OVAL STERILE LF (GAUZE/BANDAGES/DRESSINGS) ×3 IMPLANT
PAK PIK CVS CATARACT (OPHTHALMIC) ×3 IMPLANT
PROBE ANTERIOR VITRECTOR (OPHTHALMIC) IMPLANT
SPEAR EYE SURG WECK-CEL (MISCELLANEOUS) IMPLANT
STRIP CLOSURE SKIN 1/4X4 (GAUZE/BANDAGES/DRESSINGS) ×2 IMPLANT
SUT ETHILON 10 0 CS140 6 (SUTURE) IMPLANT
SUT SILK 4 0 C 3 735G (SUTURE) IMPLANT
SUT SILK 6 0 G 6 (SUTURE) IMPLANT
SUT VICRYL 8 0 TG140 8 (SUTURE) IMPLANT
SYR 3ML LL SCALE MARK (SYRINGE) IMPLANT
SYR TB 1ML LUER SLIP (SYRINGE) ×3 IMPLANT
TAPE PAPER MEDFIX 1IN X 10YD (GAUZE/BANDAGES/DRESSINGS) ×3 IMPLANT
TAPE SURG TRANSPORE 1 IN (GAUZE/BANDAGES/DRESSINGS) ×1 IMPLANT
TAPE SURGICAL TRANSPORE 1 IN (GAUZE/BANDAGES/DRESSINGS) ×2
TIP PHACO STRAIGHT 30DEG (OPHTHALMIC) ×3 IMPLANT
TOWEL OR 17X24 6PK STRL BLUE (TOWEL DISPOSABLE) ×6 IMPLANT
WATER STERILE IRR 1000ML POUR (IV SOLUTION) ×3 IMPLANT

## 2013-04-13 NOTE — Interval H&P Note (Signed)
History and Physical Interval Note:  04/13/2013 8:26 AM  Zoe [name]  has presented today for surgery, with the diagnosis of CATARACT RIGHT EYE  The various methods of treatment have been discussed with the patient and family. After consideration of risks, benefits and other options for treatment, the patient has consented to  Procedure(s): RIGHT CATARACT EXTRACTION PHACO AND INTRAOCULAR LENS PLACEMENT ([facility]) (Right) as a surgical intervention .  The patient's history has been reviewed, patient examined, no change in status, stable for surgery.  I have reviewed the patient's chart and labs.  Questions were answered to the patient's satisfaction.     Carmella Kees   

## 2013-04-13 NOTE — Interval H&P Note (Signed)
History and Physical Interval Note:  04/13/2013 8:26 AM  Zoe [name]  has presented today for surgery, with the diagnosis of CATARACT RIGHT EYE  The various methods of treatment have been discussed with the patient and family. After consideration of risks, benefits and other options for treatment, the patient has consented to  Procedure(s): RIGHT CATARACT EXTRACTION PHACO AND INTRAOCULAR LENS PLACEMENT ([place]) (Right) as a surgical intervention .  The patient's history has been reviewed, patient examined, no change in status, stable for surgery.  I have reviewed the patient's chart and labs.  Questions were answered to the patient's satisfaction.     [name]

## 2013-04-13 NOTE — Transfer of Care (Signed)
Immediate Anesthesia Transfer of Care Note  Patient: Zoe [name]  Procedure(s) Performed: Procedure(s): RIGHT CATARACT EXTRACTION PHACO AND INTRAOCULAR LENS PLACEMENT ([facility]) (Right)  Patient Location: PACU  Anesthesia Type:MAC  Level of Consciousness: awake, alert , oriented and patient cooperative  Airway & Oxygen Therapy: Patient Spontanous Breathing  Post-op Assessment: Report given to PACU RN, Post -op Vital signs reviewed and stable and Patient moving all extremities X 4  Post vital signs: Reviewed and stable  Complications: No apparent anesthesia complications

## 2013-04-13 NOTE — Discharge Instructions (Addendum)
The patient may remove the eye patch at 1:30 today. This evening appy eye drops given to the patient at [place] office. Do not rub the eye do not apply any pressure to the eye. Do not lift anything over 25 pounds no bending lifting or straining. At night sleep with the plastic eye shield covering the eye rest on back or left side did not sleep on the right side.

## 2013-04-13 NOTE — Anesthesia Preprocedure Evaluation (Addendum)
Anesthesia Evaluation  Patient identified by MRN, date of birth, ID band Patient awake    Reviewed: Allergy & Precautions, H&P , NPO status , Patient's Chart, lab work & pertinent test results  Airway Mallampati: II TM Distance: >3 FB Neck ROM: Full    Dental no notable dental hx. (+) Teeth Intact, Dental Advisory Given   Pulmonary neg pulmonary ROS,  breath sounds clear to auscultation  Pulmonary exam normal       Cardiovascular hypertension, On Medications Rhythm:Regular Rate:Normal     Neuro/Psych PSYCHIATRIC DISORDERS Depression negative neurological ROS     GI/Hepatic negative GI ROS, Neg liver ROS,   Endo/Other  negative endocrine ROS  Renal/GU negative Renal ROS  negative genitourinary   Musculoskeletal   Abdominal   Peds  Hematology negative hematology ROS (+)   Anesthesia Other Findings   Reproductive/Obstetrics negative OB ROS                          Anesthesia Physical Anesthesia Plan  ASA: II  Anesthesia Plan: MAC   Post-op Pain Management:    Induction: Intravenous  Airway Management Planned: Simple Face Mask  Additional Equipment:   Intra-op Plan:   Post-operative Plan:   Informed Consent: I have reviewed the patients History and Physical, chart, labs and discussed the procedure including the risks, benefits and alternatives for the proposed anesthesia with the patient or authorized representative who has indicated his/her understanding and acceptance.   Dental advisory given  Plan Discussed with: CRNA  Anesthesia Plan Comments:         Anesthesia Quick Evaluation

## 2013-04-13 NOTE — Anesthesia Postprocedure Evaluation (Signed)
  Anesthesia Post-op Note  Patient: Renlee Floor  Procedure(s) Performed: Procedure(s): RIGHT CATARACT EXTRACTION PHACO AND INTRAOCULAR LENS PLACEMENT ([facility]) (Right)  Patient Location: PACU  Anesthesia Type: MAC  Level of Consciousness: awake and alert   Airway and Oxygen Therapy: Patient Spontanous Breathing  Post-op Pain: none  Post-op Assessment: Post-op Vital signs reviewed, Patient's Cardiovascular Status Stable and Respiratory Function Stable  Post-op Vital Signs: Reviewed  Filed Vitals:   04/13/13 1003  BP: 174/82  Pulse: 55  Temp: 36.2 C  Resp: 16    Complications: No apparent anesthesia complications

## 2013-04-13 NOTE — Anesthesia Procedure Notes (Signed)
Procedure Name: MAC Date/Time: 04/13/2013 8:30 AM Performed by: Ned Grace Pre-anesthesia Checklist: Patient identified, Patient being monitored, Emergency Drugs available, Timeout performed and Suction available Patient Re-evaluated:Patient Re-evaluated prior to inductionOxygen Delivery Method: Nasal cannula Intubation Type: IV induction

## 2013-04-13 NOTE — Interval H&P Note (Signed)
History and Physical Interval Note:  04/13/2013 8:24 AM  Zoe [name]  has presented today for surgery, with the diagnosis of CATARACT RIGHT EYE  The various methods of treatment have been discussed with the patient and family. After consideration of risks, benefits and other options for treatment, the patient has consented to  Procedure(s): RIGHT CATARACT EXTRACTION PHACO AND INTRAOCULAR LENS PLACEMENT ([place]) (Right) as a surgical intervention .  The patient's history has been reviewed, patient examined, no change in status, stable for surgery.  I have reviewed the patient's chart and labs.  Questions were answered to the patient's satisfaction.     [name]

## 2013-04-13 NOTE — H&P (View-Only) (Signed)
History & Physical:   DATE:   04-07-13  NAME:  Kynslei, Art     [id number]       HISTORY OF PRESENT ILLNESS: Chief Eye Complaints   Glaucoma  patient  : Patient is having trouble seeing @ Dist vision is cloudy and blurry   patient notes difficulty with car lights while driving at night in particular.,Patient states that she is ready for Cat, Surgery    HPI: EYES: Reports symptoms of OD cloudy and Blurry             ACTIVE PROBLEMS: Visually significant cataract, right eye Primary open angle glaucoma   ICD#365.11  Onset:   Initial Date:    treatment.  Visual Fields  progression noted OD, stable OS  Nuclear cataract NOS   ICD#366.04  Onset:   Initial Date:  SURGERIES:   SLT 06-23-2012 OD nasal SLT 09-09-12 OD TEMP  MEDICATIONS: Travatan Z: Strength-  SIG-  1 gtt in each affected eye once a day (in the evening) for 30 days  Combigan: 0.2%-0.5% solution SIG-  1 gtt in each affected eye every 12 hours for 30 days 1 drop eqch eye twice daily   6 am  REVIEW OF SYSTEMS: ROS:   GEN- Constitutional: negative except as noted HENT: poor vision  GEN - Endocrine: Reports symptoms of LUNGS/Respiratory:  HEART/Cardiovascular: Reports symptoms of hypertension ABD/Gastrointestinal:   Musculoskeletal (BJE): NEURO/Neurological: PSYCH/Psychiatric:    Is the pt oriented to time, place, person? yes  Mood normal    TOBACCO:  Never smoker   ICD#V13.89    SOCIAL HISTORY  works [employer]   FAMILY HISTORY: Positive family history for  -   Glaucoma:  Glaucoma/Diabetes/HTN Family History - 1st Degree Relatives:  Mother alive and well.  Father alive and well.  ALLERGIES:  Drug Allergies.  No Known.   PHYSICAL EXAMINATION: VS: BMI: 23.7.  BP: 145/83.  H: 65.00 in.  P: 66 /min.  RR: 20 /min.  W: 142lbs 0oz.    Va     OD:cc 20/40+ PH:20/NI OS:cc 20/20-  EYEGLASSES:  OD:-7.50 + 1.00 x 018                                          OS:-4.00 + 0.75 x  062 ADD:+2.00  MR02/07/2013 12:22    OD -7.25 +0.50 x 024    20/40  OS  -4.00 +0.75 x 024    20/25+ ADD  K's02/07/2013 11:54  OD:43.25 44.50 44.00 OS:43.25 45.25 44.25   VF:  OD:inf arcuate progressed                                            OS: arcuate defect   Motility :orthophoria and full  PUPILS: 3 mm round reactive negative Marcus Gunn  EYELIDS & OCULAR ADNEXA :normal each eye  SLE: Conjunctiva:trace injection each eye  Cornea: decrease tear film each eye and arcus   Anterior Chamber:  deep and quiet each eye  Iris:Brown each eye  Lens: OD:+ 3 nuclear sclerosis  OS:1-2 nuclear sclerosis   Ta   in mmHg    OD: 19       OS:20  Time:04/07/2013 12:54   Dilation:  phenylephrine 2.5%    Fundus: optic nerve   OD:75-80%  cup pink color                                               HW:EXHB color 55-60% cup Macula:       OD:   CLEAR                                                  OS:  CLEAR Vessels:  normal    Periphery:  normal    Ocular coherent tomography OD reveals a very thin diffusely nerve fiber layer, left eye also reveals a thin nerve fiber layer but low signal strength OS decreases reliability OS    Exam: GENERAL: Appearance: General appearance can be described as well-nourished, well-developed, and in no acute distress.    HEAD, EARS, NOSE AND THROAT: Ears-Nose (external) Inspection: Externally, nose and ears are normal in appearance and without scars, lesions, or nodules.      Hearing assessment shows no problems with normal conversation.     NECK: Neck tissue exam demonstrates no masses, symmetrical, and trachea is midline.      LUNGS and RESPIRATORY: Lung auscultation elicits no wheezing, rhonci, rales or rubs and with equal breath sounds.    Respiratory effort described as breathing is unlabored and chest movement is symmetrical.    HEART (Cardiovascular): Heart auscultation discovers regular rate and rhythm; no murmur, gallop or rub. Normal  heart sounds.    ABDOMEN (Gastrointestinal): Mass/Tenderness Exam: Neither are present.     MUSCULOSKELETAL (BJE): Inspection-Palpation: No major bone, joint, tendon, or muscle changes.      NEUROLOGICAL: Alert and oriented. No major deficits of coordination or sensation.      PSYCHIATRIC: Insight and judgment appear  both to be intact and appropriate.    Mood and affect are described as normal mood and full affect.    SKIN: Skin Inspection: No rashes or lesions  ADMITTING DIAGNOSIS: Visually significant cataract, right eye Primary open angle glaucoma   ICD#365.11  Onset:    treatment.  Visual Fields  progression noted OD, stable OS  Nuclear cataract NOS   ICD#366.04  Onset:  SURGICAL TREATMENT PLAN: phaco emulsion cataract extraction  emulsification with intraocular lens implant OD  Risk and benefits of surgery have been reviewed with the patient and the patient agrees to proceed with the surgical procedure.   We discussed astigmatism we discussed glaucoma control before and after cataract surgery.  The patient's glaucoma is not adequately controlled.  She understands that she might require glaucoma surgery.  The current operation will be for cataract surgery and visual improvement.    ___________________________ Marylynn Pearson, Brooke Bonito Starter - Inactive Problems:

## 2013-04-13 NOTE — Op Note (Signed)
Preoperative diagnosis: Visually significant cataract right eye Postoperative diagnosis: Same Procedure: Phacoemulsification with intraocular lens implant Complications: None Anesthesia 2% Xylocaine in a 50-50 mixture of 0.75% Marcaine with ample Wydase Procedure: The patient was transported to the operating room where she was given a peribulbar block with the aforementioned local anesthetic agent. Following this the patient's face was prepped and draped in the usual sterile fashion. With the lid speculum inserted and the operating microscope in position a Weck-Cel sponge was used to fixate the globe and a 15 blade was used to enter through clear cornea at the 10:30 position Viscoat was injected into the anterior chamber following this with another Weck-Cel sponge in position a 2.75 mm keratome blade was used in a stepwise fashion through temporal clear cornea to into the anterior chamber and additional Viscoat was injected. A bent 25-gauge needle was used to incise anterior capsule and a continuous tear curvilinear capsulorrhexis was formed. The Utrata forceps were used to remove the anterior capsule. BSS was used to hydrodissect and hydrodelineate the nucleus the nucleus was noted to rotate then the capsular bag the phacoemulsification after entailing the nucleus the Kuglen hook was used to separate the nucleus into 4 quadrants the nuclear fragments were then separated and removed from the eye and the posterior capsule remained intact with the epinucleus in place BSS was used to hydrate the epinucleus the irrigation aspiration handpiece was then used to aspirate the epinucleus after the epinucleus the cortical fibers were stripped from the posterior capsule after all cortical fibers had been removed Provisc was injected into the capsular bag. The intraocular lens implant was examined and noted to have no defects the lens was an Alcon AcrySof SN 60 WF IQ lens 16.5 diopter SN #[id number] the lens is placed  in the lens injector and injected in the eye the trailing haptic was not folded after further retraction the lens released and the Kuglen hook was used to position the lens in the capsular bag. The irrigation aspiration device was used to remove viscoelastic from the eye the incision was hydrated Miochol was injected into the anterior chamber the eye was pressurized and there was no leakage therefore all instruments were removed from the eye and topical TobraDex ointment was applied to the eye a patch and Fox U. were placed and the patient returned to recovery area in stable condition. Marylynn Pearson Junior M.D.

## 2013-04-14 ENCOUNTER — Encounter (HOSPITAL_COMMUNITY): Payer: Self-pay | Admitting: Ophthalmology

## 2013-05-26 ENCOUNTER — Ambulatory Visit: Payer: Self-pay | Admitting: Physician Assistant

## 2013-06-14 ENCOUNTER — Ambulatory Visit: Payer: Self-pay | Admitting: Anesthesiology

## 2013-06-14 LAB — POTASSIUM: POTASSIUM: 3.5 mmol/L (ref 3.5–5.1)

## 2013-06-15 ENCOUNTER — Ambulatory Visit: Payer: Self-pay | Admitting: Urology

## 2013-06-28 ENCOUNTER — Ambulatory Visit: Payer: Self-pay | Admitting: Urology

## 2013-06-28 LAB — PREGNANCY, URINE: PREGNANCY TEST, URINE: NEGATIVE m[IU]/mL

## 2013-07-27 ENCOUNTER — Ambulatory Visit: Payer: Self-pay | Admitting: Urology

## 2013-10-08 IMAGING — CR DG ABDOMEN 1V
1 series · 2 of 2 positions shown · non-contrast
Comparison: none

REASON FOR EXAM: NEPHROLITHIASIS AND RENAL COLIC
COMMENTS:

[Series 1: ap · 0.17mm/px · 2 of 2 slices shown]
[im 1/2]
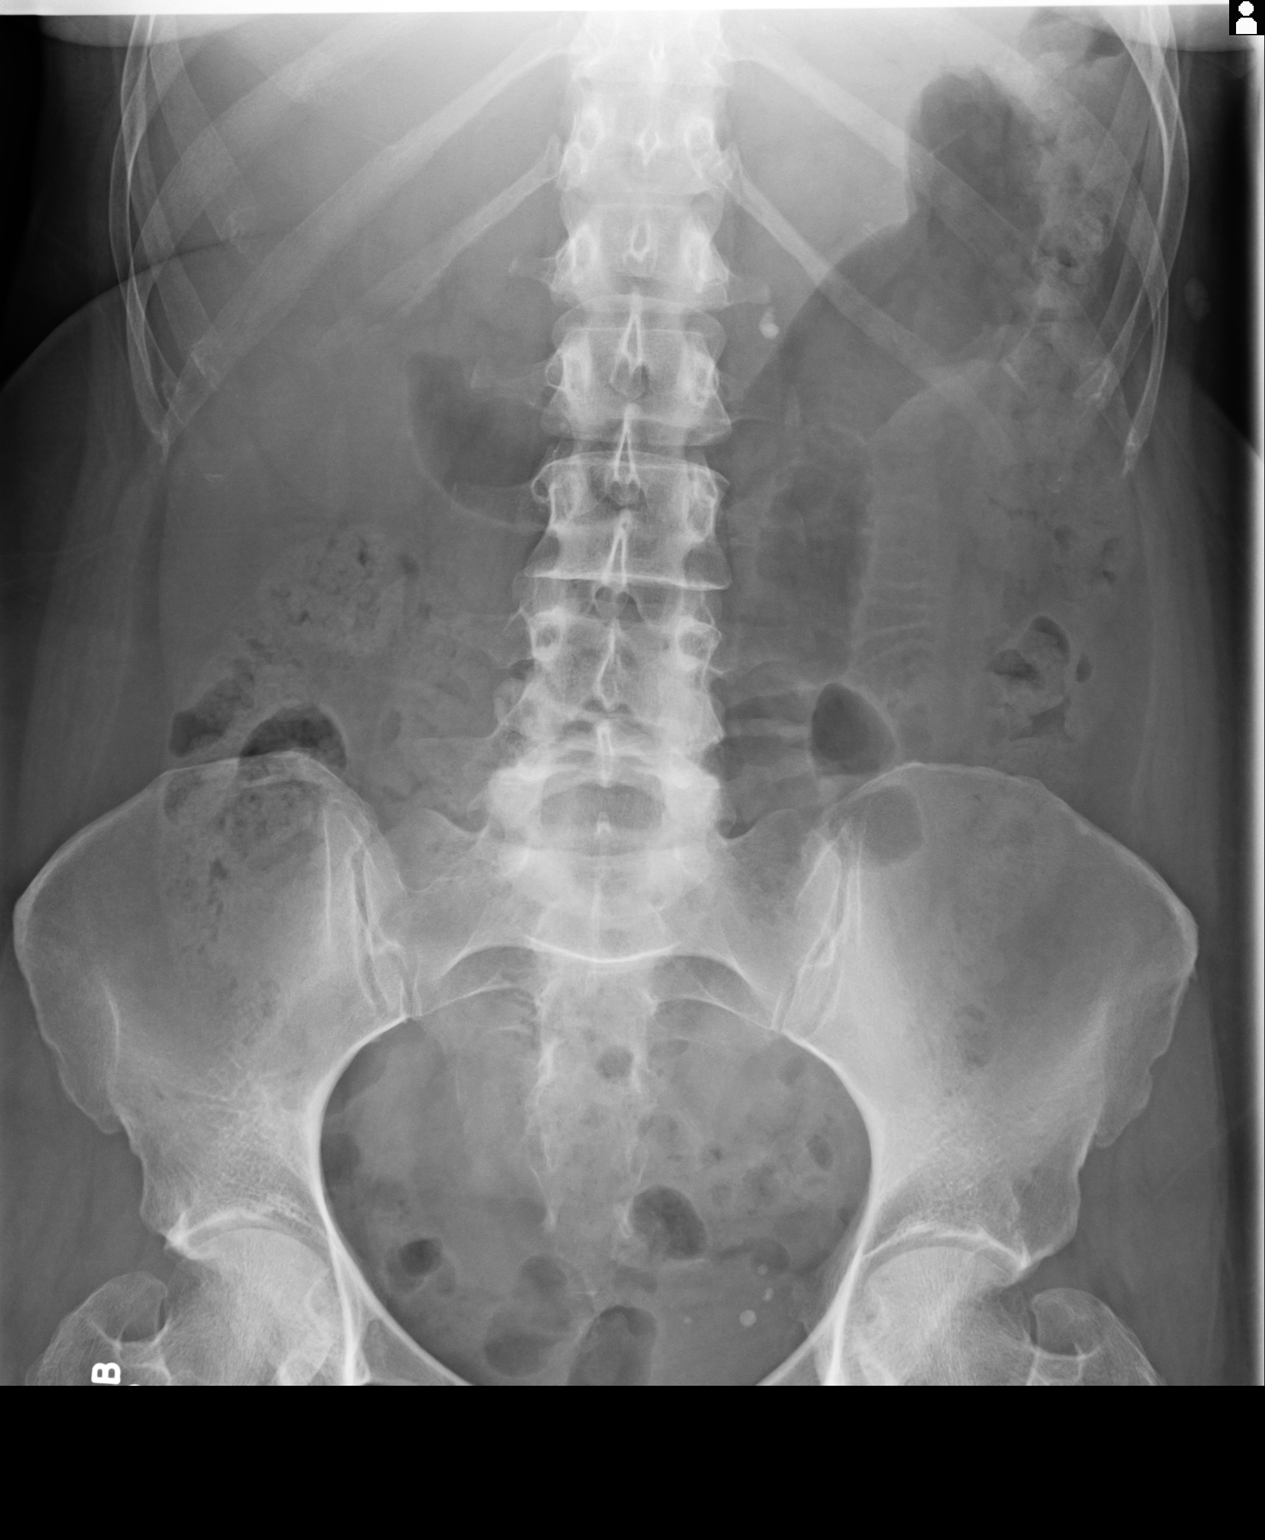
[im 2/2]
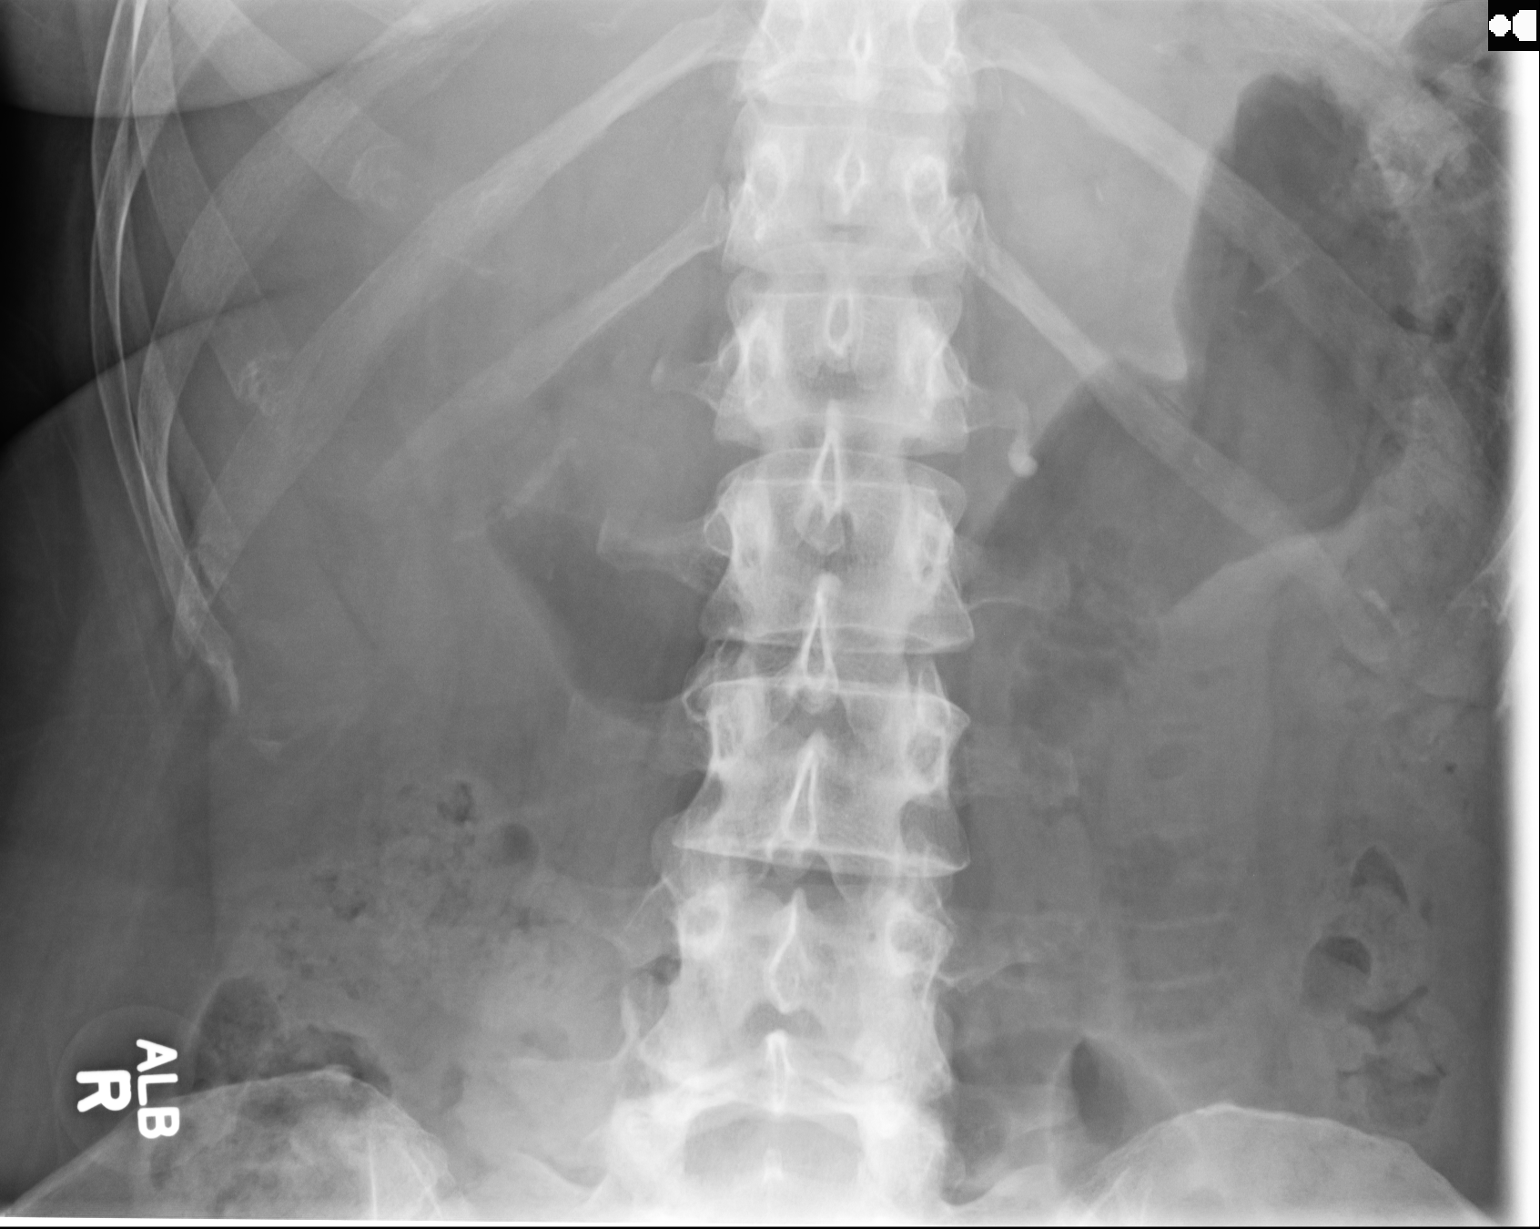

[2 of 2 positions shown; findings below may reference images not displayed]

PROCEDURE:     MDR - MDR KIDNEY URETER BLADDER  - December 02, 2012 [DATE]

RESULT:     There is a coarse calcification just lateral to the L1-L2 disc
space on the left. This is irregularly marginated and measures approximately
3 by 4 mm with an adjacent 2 x 3 mm calcification. On the right no definite
stones project in the upper abdomen. Within the pelvis there are likely
phleboliths. The bowel gas pattern and bony structures are within the limits
of normal for age.
IMPRESSION: The findings suggest stones in the renal pelvis on the
left.

[REDACTED]

## 2013-11-03 ENCOUNTER — Ambulatory Visit: Payer: Self-pay | Admitting: Physician Assistant

## 2013-11-08 ENCOUNTER — Ambulatory Visit: Payer: Self-pay | Admitting: Physician Assistant

## 2013-11-15 ENCOUNTER — Other Ambulatory Visit: Payer: Self-pay | Admitting: Obstetrics and Gynecology

## 2013-11-15 ENCOUNTER — Other Ambulatory Visit (HOSPITAL_COMMUNITY)
Admission: RE | Admit: 2013-11-15 | Discharge: 2013-11-15 | Disposition: A | Discharge status: Home / Self Care | Payer: BC Managed Care – PPO | Source: Ambulatory Visit | Attending: Obstetrics and Gynecology | Admitting: Obstetrics and Gynecology

## 2013-11-15 DIAGNOSIS — Z01419 Encounter for gynecological examination (general) (routine) without abnormal findings: Secondary | ICD-10-CM | POA: Insufficient documentation

## 2013-11-16 LAB — CYTOLOGY - PAP

## 2014-03-10 ENCOUNTER — Ambulatory Visit: Payer: Self-pay | Admitting: Unknown Physician Specialty

## 2014-05-09 ENCOUNTER — Ambulatory Visit: Payer: Self-pay | Admitting: Physician Assistant

## 2014-06-24 NOTE — Op Note (Signed)
PATIENT NAME:  Zoe [name], Zoe [name] MR#:  [id number] DATE OF BIRTH:  06-Jul-1955  DATE OF PROCEDURE:  06/15/2013  PREOPERATIVE DIAGNOSIS: Left proximal ureteral calculus.   POSTOPERATIVE DIAGNOSIS: Left proximal ureteral calculus.  PROCEDURES: 1. Left ureteroscopy with holmium laser lithotripsy, stone extraction.  2. Placement of left ureteral stent.   SURGEON: John Giovanni, M.D.   ASSISTANT: None.   ANESTHESIA: General.   INDICATIONS: This is a 59 year old female initially seen October 2014 with left renal colic and CT showing a 6 mm left proximal ureteral calculus. She was scheduled for shockwave lithotripsy; however, her pain resolved and she thought she passed the stone. She had recurrent pain earlier this month and repeat CT showed persistence of a left proximal stone now measuring approximately 8 mm. Treatment options were discussed. Due to the length of time the stone has been present it was felt there may be a fair amount of ureteral edema which may preclude fragment passage on lithotripsy. She has elected ureteroscopic removal.   DESCRIPTION OF PROCEDURE: She was taken to the cystoscopy suite and placed on the table in the supine position. A general anesthetic was administered via an LMA and she was then placed in the low lithotomy position. Her external genitalia were prepped and draped in the usual fashion. Timeout was performed per protocol. A 21 French cystoscope sheath with obturator was lubricated and passed per urethra without difficulty. Panendoscopy was then performed, and the bladder mucosa was normal in appearance without erythema, solid or papillary lesions. The ureteral orifices were normal in position. Bilateral efflux was noted. A 8.295 hydrophilic guidewire was placed through the cystoscope and into the left ureteral orifice. The wire was negotiated past the stone under fluoroscopic guidance and passed up into the renal pelvis. The cystoscope was removed and a 6 French  semirigid ureteroscope was passed per urethra. A 0.025 PTFE guidewire was placed through the ureteroscope and into the orifice, and the ureteroscope was advanced over the wire. The 0.025 wire was removed and the ureteroscope was advanced proximally. In the proximal ureter below the stone was a significant amount of ureteral mucosal edema. The ureteroscope was negotiated past this area and the stone was visualized. A 365 micro holmium laser fiber was placed through the ureteroscope. The stone was then easily fragmented and small fragments were flushed with the slightly large fragments removed with a 3 [place] nitinol basket. At the completion of the procedure, no fragments were seen in the ureter. On fluoroscopy no fragments were appreciated in the kidney. Retrograde pyelogram was performed which shows moderate left hydronephrosis. No contrast extravasation was seen. A 6 French/22 cm Contour ureteral stent was then placed over the wire. There was good curl seen in the renal pelvis on fluoroscopy. Cystoscope was repassed and the distal end of the stent was well positioned in the bladder. The bladder was emptied and the cystoscope was removed. A B and O suppository was placed per rectum. She was taken to the PACU in stable condition. There were no complications. EBL was minimal.    ____________________________ Ronda Fairly. Bernardo Heater, MD scs:sg D: 06/15/2013 15:29:39 ET T: 06/15/2013 16:07:40 ET JOB#: [id number]  cc: Nicki Reaper C. Bernardo Heater, MD, <Dictator> Abbie Sons MD ELECTRONICALLY SIGNED 06/17/2013 15:14

## 2014-11-21 ENCOUNTER — Other Ambulatory Visit (HOSPITAL_COMMUNITY)
Admission: RE | Admit: 2014-11-21 | Discharge: 2014-11-21 | Disposition: A | Discharge status: Home / Self Care | Payer: BLUE CROSS/BLUE SHIELD | Source: Ambulatory Visit | Attending: Obstetrics and Gynecology | Admitting: Obstetrics and Gynecology

## 2014-11-21 ENCOUNTER — Other Ambulatory Visit: Payer: Self-pay | Admitting: Obstetrics and Gynecology

## 2014-11-21 DIAGNOSIS — Z01419 Encounter for gynecological examination (general) (routine) without abnormal findings: Secondary | ICD-10-CM | POA: Insufficient documentation

## 2014-11-22 LAB — CYTOLOGY - PAP

## 2015-12-21 ENCOUNTER — Other Ambulatory Visit: Payer: Self-pay | Admitting: Physician Assistant

## 2015-12-21 DIAGNOSIS — R921 Mammographic calcification found on diagnostic imaging of breast: Secondary | ICD-10-CM

## 2015-12-21 DIAGNOSIS — Z1239 Encounter for other screening for malignant neoplasm of breast: Secondary | ICD-10-CM

## 2016-01-15 ENCOUNTER — Ambulatory Visit
Admission: RE | Admit: 2016-01-15 | Discharge: 2016-01-15 | Disposition: A | Discharge status: Home / Self Care | Payer: BLUE CROSS/BLUE SHIELD | Source: Ambulatory Visit | Attending: Physician Assistant | Admitting: Physician Assistant

## 2016-01-15 DIAGNOSIS — Z1239 Encounter for other screening for malignant neoplasm of breast: Secondary | ICD-10-CM

## 2016-01-15 DIAGNOSIS — R921 Mammographic calcification found on diagnostic imaging of breast: Secondary | ICD-10-CM | POA: Diagnosis not present

## 2016-04-10 ENCOUNTER — Other Ambulatory Visit: Payer: Self-pay | Admitting: Physician Assistant

## 2016-04-10 DIAGNOSIS — R109 Unspecified abdominal pain: Secondary | ICD-10-CM

## 2016-04-14 ENCOUNTER — Ambulatory Visit
Admission: RE | Admit: 2016-04-14 | Discharge: 2016-04-14 | Disposition: A | Discharge status: Home / Self Care | Payer: BLUE CROSS/BLUE SHIELD | Source: Ambulatory Visit | Attending: Physician Assistant | Admitting: Physician Assistant

## 2016-04-14 DIAGNOSIS — R109 Unspecified abdominal pain: Secondary | ICD-10-CM | POA: Diagnosis present

## 2016-04-14 DIAGNOSIS — N83201 Unspecified ovarian cyst, right side: Secondary | ICD-10-CM | POA: Insufficient documentation

## 2016-04-14 DIAGNOSIS — D259 Leiomyoma of uterus, unspecified: Secondary | ICD-10-CM | POA: Insufficient documentation

## 2016-04-14 DIAGNOSIS — K381 Appendicular concretions: Secondary | ICD-10-CM | POA: Diagnosis not present

## 2016-04-14 DIAGNOSIS — N200 Calculus of kidney: Secondary | ICD-10-CM | POA: Diagnosis not present

## 2016-04-14 DIAGNOSIS — I700 Atherosclerosis of aorta: Secondary | ICD-10-CM | POA: Insufficient documentation

## 2016-04-29 ENCOUNTER — Other Ambulatory Visit: Payer: Self-pay | Admitting: Physician Assistant

## 2016-04-29 DIAGNOSIS — R51 Headache: Secondary | ICD-10-CM

## 2016-04-29 DIAGNOSIS — R519 Headache, unspecified: Secondary | ICD-10-CM

## 2016-04-29 DIAGNOSIS — M542 Cervicalgia: Secondary | ICD-10-CM

## 2016-04-30 ENCOUNTER — Other Ambulatory Visit
Admission: RE | Admit: 2016-04-30 | Discharge: 2016-04-30 | Disposition: A | Discharge status: Home / Self Care | Payer: BLUE CROSS/BLUE SHIELD | Source: Ambulatory Visit | Attending: Physician Assistant | Admitting: Physician Assistant

## 2016-04-30 DIAGNOSIS — R071 Chest pain on breathing: Secondary | ICD-10-CM | POA: Diagnosis present

## 2016-04-30 LAB — FIBRIN DERIVATIVES D-DIMER (ARMC ONLY): Fibrin derivatives D-dimer (ARMC): <45 (ref 0–499)

## 2016-05-05 ENCOUNTER — Other Ambulatory Visit: Payer: PRIVATE HEALTH INSURANCE

## 2016-05-05 ENCOUNTER — Ambulatory Visit
Admission: RE | Admit: 2016-05-05 | Discharge: 2016-05-05 | Disposition: A | Discharge status: Home / Self Care | Payer: BLUE CROSS/BLUE SHIELD | Source: Ambulatory Visit | Attending: Physician Assistant | Admitting: Physician Assistant

## 2016-05-09 ENCOUNTER — Ambulatory Visit
Admission: RE | Admit: 2016-05-09 | Discharge: 2016-05-09 | Disposition: A | Discharge status: Home / Self Care | Payer: BLUE CROSS/BLUE SHIELD | Source: Ambulatory Visit | Attending: Physician Assistant | Admitting: Physician Assistant

## 2016-05-09 DIAGNOSIS — M542 Cervicalgia: Secondary | ICD-10-CM

## 2016-05-09 DIAGNOSIS — M50322 Other cervical disc degeneration at C5-C6 level: Secondary | ICD-10-CM | POA: Diagnosis not present

## 2016-05-09 DIAGNOSIS — R51 Headache: Secondary | ICD-10-CM | POA: Insufficient documentation

## 2016-05-09 DIAGNOSIS — R519 Headache, unspecified: Secondary | ICD-10-CM

## 2016-11-25 ENCOUNTER — Other Ambulatory Visit: Payer: Self-pay | Admitting: Obstetrics and Gynecology

## 2016-11-25 ENCOUNTER — Other Ambulatory Visit (HOSPITAL_COMMUNITY)
Admission: RE | Admit: 2016-11-25 | Discharge: 2016-11-25 | Disposition: A | Discharge status: Home / Self Care | Payer: BLUE CROSS/BLUE SHIELD | Source: Ambulatory Visit | Attending: Obstetrics and Gynecology | Admitting: Obstetrics and Gynecology

## 2016-11-25 DIAGNOSIS — Z124 Encounter for screening for malignant neoplasm of cervix: Secondary | ICD-10-CM | POA: Diagnosis not present

## 2016-11-25 DIAGNOSIS — N644 Mastodynia: Secondary | ICD-10-CM

## 2016-11-27 LAB — CYTOLOGY - PAP
DIAGNOSIS: NEGATIVE
HPV (WINDOPATH): NOT DETECTED

## 2016-12-02 ENCOUNTER — Other Ambulatory Visit: Payer: PRIVATE HEALTH INSURANCE

## 2017-01-21 ENCOUNTER — Other Ambulatory Visit: Payer: Self-pay | Admitting: Obstetrics and Gynecology

## 2017-01-21 ENCOUNTER — Other Ambulatory Visit: Payer: Self-pay | Admitting: Physician Assistant

## 2017-01-21 DIAGNOSIS — N644 Mastodynia: Secondary | ICD-10-CM

## 2017-05-05 ENCOUNTER — Other Ambulatory Visit: Payer: Self-pay | Admitting: Physician Assistant

## 2017-05-12 ENCOUNTER — Other Ambulatory Visit: Payer: Self-pay | Admitting: Obstetrics and Gynecology

## 2017-05-12 DIAGNOSIS — N644 Mastodynia: Secondary | ICD-10-CM

## 2017-05-18 ENCOUNTER — Ambulatory Visit
Admission: RE | Admit: 2017-05-18 | Discharge: 2017-05-18 | Disposition: A | Discharge status: Home / Self Care | Payer: BLUE CROSS/BLUE SHIELD | Source: Ambulatory Visit | Attending: Obstetrics and Gynecology | Admitting: Obstetrics and Gynecology

## 2017-05-18 DIAGNOSIS — N644 Mastodynia: Secondary | ICD-10-CM | POA: Insufficient documentation

## 2017-05-25 ENCOUNTER — Other Ambulatory Visit: Payer: Self-pay | Admitting: Family Medicine

## 2017-05-25 ENCOUNTER — Ambulatory Visit
Admission: RE | Admit: 2017-05-25 | Discharge: 2017-05-25 | Disposition: A | Discharge status: Home / Self Care | Payer: BLUE CROSS/BLUE SHIELD | Source: Ambulatory Visit | Attending: Family Medicine | Admitting: Family Medicine

## 2017-05-25 DIAGNOSIS — R55 Syncope and collapse: Secondary | ICD-10-CM | POA: Diagnosis not present

## 2017-05-25 DIAGNOSIS — R519 Headache, unspecified: Secondary | ICD-10-CM

## 2017-05-25 DIAGNOSIS — R51 Headache: Secondary | ICD-10-CM | POA: Insufficient documentation

## 2018-10-01 ENCOUNTER — Other Ambulatory Visit
Admission: RE | Admit: 2018-10-01 | Discharge: 2018-10-01 | Disposition: A | Discharge status: Home / Self Care | Payer: BC Managed Care – PPO | Source: Ambulatory Visit | Attending: Physician Assistant | Admitting: Physician Assistant

## 2018-10-01 DIAGNOSIS — R600 Localized edema: Secondary | ICD-10-CM | POA: Diagnosis present

## 2018-10-01 LAB — BRAIN NATRIURETIC PEPTIDE: B Natriuretic Peptide: 45 pg/mL (ref 0.0–100.0)

## 2019-01-17 ENCOUNTER — Other Ambulatory Visit: Payer: Self-pay | Admitting: Physician Assistant

## 2019-01-17 DIAGNOSIS — Z1231 Encounter for screening mammogram for malignant neoplasm of breast: Secondary | ICD-10-CM

## 2019-02-09 ENCOUNTER — Other Ambulatory Visit: Payer: Self-pay | Admitting: Obstetrics and Gynecology

## 2019-02-09 DIAGNOSIS — Z1231 Encounter for screening mammogram for malignant neoplasm of breast: Secondary | ICD-10-CM

## 2019-02-09 DIAGNOSIS — N644 Mastodynia: Secondary | ICD-10-CM

## 2019-02-17 ENCOUNTER — Ambulatory Visit
Admission: RE | Admit: 2019-02-17 | Discharge: 2019-02-17 | Disposition: A | Discharge status: Home / Self Care | Payer: BC Managed Care – PPO | Source: Ambulatory Visit | Attending: Obstetrics and Gynecology | Admitting: Obstetrics and Gynecology

## 2019-02-17 DIAGNOSIS — Z1231 Encounter for screening mammogram for malignant neoplasm of breast: Secondary | ICD-10-CM | POA: Insufficient documentation

## 2019-02-17 DIAGNOSIS — N644 Mastodynia: Secondary | ICD-10-CM | POA: Diagnosis not present

## 2019-02-18 ENCOUNTER — Ambulatory Visit: Payer: BC Managed Care – PPO | Admitting: Urology

## 2019-02-18 ENCOUNTER — Encounter: Payer: Self-pay | Admitting: Urology

## 2019-02-18 ENCOUNTER — Other Ambulatory Visit: Payer: Self-pay

## 2019-02-18 ENCOUNTER — Other Ambulatory Visit: Payer: BC Managed Care – PPO

## 2019-02-18 DIAGNOSIS — N200 Calculus of kidney: Secondary | ICD-10-CM | POA: Diagnosis not present

## 2019-02-18 DIAGNOSIS — R3129 Other microscopic hematuria: Secondary | ICD-10-CM

## 2019-02-18 HISTORY — DX: Other microscopic hematuria: R31.29

## 2019-02-18 LAB — MICROSCOPIC EXAMINATION

## 2019-02-18 LAB — URINALYSIS, COMPLETE
Bilirubin, UA: NEGATIVE
Glucose, UA: NEGATIVE
Ketones, UA: NEGATIVE
Nitrite, UA: NEGATIVE
Specific Gravity, UA: 1.025 (ref 1.005–1.030)
Urobilinogen, Ur: 0.2 mg/dL (ref 0.2–1.0)
pH, UA: 5.5 (ref 5.0–7.5)

## 2019-02-18 NOTE — Progress Notes (Signed)
02/18/2019 9:56 AM   Zoe [name] 03/20/1955 [id number]:[id number]  Referring provider: Marinda Elk, MD [facility]  [place] [address]  Chief Complaint  Patient presents with  . Hematuria    HPI: 63 y.o. female seen at the request of Boykin Reaper, [place] for evaluation of microhematuria.  Recent urinalysis November 2020 showed >50 RBCs on microscopy.  UA 1 year prior with 4-10 RBCs.  She has a history of bilateral nephrolithiasis.  I last saw her at [facility] in 2018.  I initially saw her in 2014 for an 8 mm [name] proximal ureteral calculus treated with ureteroscopic stone removal April 2015.  24-hour urine study showed only low urine volume at 600 mL.  She had a CT in 2018 which showed bilateral, nonobstructing renal calculi the largest measuring 5 mm.  She currently denies flank or abdominal pain.  Denies dysuria or gross hematuria.   PMH: Past Medical History:  Diagnosis Date  . Cataracts, bilateral    Hx: of  . Depression    Hx; of  . Glaucoma    Hx; of  . Hypertension   . Kidney stones    Hx: of  . Syncope    Hx; of one episode  . 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 CATARACT EXTRACTION W/PHACO Right 04/13/2013   Procedure: RIGHT CATARACT EXTRACTION PHACO AND INTRAOCULAR LENS PLACEMENT ([facility]);  Surgeon: Marylynn Pearson, MD;  Location: [facility];  Service: Ophthalmology;  Laterality: Right;  . COLONOSCOPY     Hx; of  . CRYOABLATION     of cervix  . DILATION AND CURETTAGE OF UTERUS    . EYE SURGERY     Hx: of laser surgery  . KNEE ARTHROSCOPY     Hx: of right knee  . SHOULDER ARTHROSCOPY W/ ROTATOR CUFF REPAIR     Hx: of right shoulder  . TUBAL LIGATION      Home Medications:  Allergies as of 02/18/2019   No Known Allergies     Medication List       Accurate as of February 18, 2019  9:56 AM. If you have any questions, ask your nurse or doctor.        STOP  taking these medications   valsartan-hydrochlorothiazide 160-25 MG tablet Commonly known as: DIOVAN-HCT Stopped by: Abbie Sons, MD     TAKE these medications   acetaminophen 500 MG tablet Commonly known as: TYLENOL Take 500 mg by mouth every 6 (six) hours as needed for mild pain.   aspirin EC 81 MG tablet Take 81 mg by mouth daily.   Combigan 0.2-0.5 % ophthalmic solution Generic drug: brimonidine-timolol Place 1 drop into both eyes every 12 (twelve) hours.   HAIR/SKIN/NAILS PO Take 3 tablets by mouth daily.   JUICE PLUS FIBRE PO Take 2 capsules by mouth daily.   losartan 25 MG tablet Commonly known as: COZAAR Take 25 mg by mouth daily.   losartan-hydrochlorothiazide 50-12.5 MG tablet Commonly known as: HYZAAR Take by mouth.   Travoprost (BAK Free) 0.004 % Soln ophthalmic solution Commonly known as: TRAVATAN Place 1 drop into both eyes at bedtime.       Allergies: No Known Allergies  Family History: Family History  Problem Relation Age of Onset  . Hypertension Mother   . Diabetes Mother   . Hypertension Father   . Glaucoma Father   . Cancer Other   .  Breast cancer Paternal Aunt   . Breast cancer Paternal Aunt     Social History:  reports that she has never smoked. She has never used smokeless tobacco. She reports current alcohol use. She reports that she does not use drugs.  ROS: UROLOGY Frequent Urination?: No Hard to postpone urination?: No Burning/pain with urination?: No Get up at night to urinate?: No Leakage of urine?: No Urine stream starts and stops?: No Trouble starting stream?: No Do you have to strain to urinate?: No Blood in urine?: No Urinary tract infection?: No Sexually transmitted disease?: No Injury to kidneys or bladder?: No Painful intercourse?: No Weak stream?: No Currently pregnant?: No Vaginal bleeding?: No Last menstrual period?: N  Gastrointestinal Nausea?: No Vomiting?: No Indigestion/heartburn?:  No Diarrhea?: No Constipation?: No  Constitutional Fever: No Night sweats?: No Weight loss?: No Fatigue?: No  Skin Skin rash/lesions?: No Itching?: No  Eyes Blurred vision?: No Double vision?: No  Ears/Nose/Throat Sore throat?: No Sinus problems?: No  Hematologic/Lymphatic Swollen glands?: No Easy bruising?: No  Cardiovascular Leg swelling?: No Chest pain?: No  Respiratory Cough?: No Shortness of breath?: No  Endocrine Excessive thirst?: No  Musculoskeletal Back pain?: No Joint pain?: No  Neurological Headaches?: No Dizziness?: No  Psychologic Depression?: No Anxiety?: No  Physical Exam: BP (!) 149/89   Pulse 76   Ht 5\' 4"  (1.626 m)   Wt 135 lb (61.2 kg)   BMI 23.17 kg/m   Constitutional:  Alert and oriented, No acute distress. HEENT: [place] AT, moist mucus membranes.  Trachea midline, no masses. Cardiovascular: No clubbing, cyanosis, or edema. Respiratory: Normal respiratory effort, no increased work of breathing. Skin: No rashes, bruises or suspicious lesions. Neurologic: Grossly intact, no focal deficits, moving all 4 extremities. Psychiatric: Normal mood and affect.  Laboratory Data:  Urinalysis Dipstick 1+ blood, trace leukocytes Microscopy 6-10 WBC 3-10 RBC, calcium oxalate crystals  Assessment & Plan:    - Bilateral nephrolithiasis This is the most likely cause of her microhematuria.  I recommended scheduling a noncontrast CT of the abdomen pelvis.  She will be notified with the results and further recommendations.   Abbie Sons, [place] [address] [place], [place] [address] [phone]

## 2019-03-11 ENCOUNTER — Other Ambulatory Visit: Payer: Self-pay

## 2019-03-11 ENCOUNTER — Ambulatory Visit
Admission: RE | Admit: 2019-03-11 | Discharge: 2019-03-11 | Disposition: A | Discharge status: Home / Self Care | Payer: BC Managed Care – PPO | Source: Ambulatory Visit | Attending: Urology | Admitting: Urology

## 2019-03-11 DIAGNOSIS — N200 Calculus of kidney: Secondary | ICD-10-CM

## 2019-03-11 DIAGNOSIS — R3129 Other microscopic hematuria: Secondary | ICD-10-CM | POA: Diagnosis present

## 2019-03-12 ENCOUNTER — Telehealth: Payer: Self-pay | Admitting: Urology

## 2019-03-12 DIAGNOSIS — N200 Calculus of kidney: Secondary | ICD-10-CM

## 2019-03-12 NOTE — Telephone Encounter (Signed)
CT does show bilateral, nonobstructing renal calculi.  She has 1 stone in the right kidney and 4 stones in the left kidney.  The calculi in the left kidney are larger.  They are in a location that typically does not cause pain.  Options include observation.  Due to the number of stones lithotripsy would be difficult and ureteroscopy would be the best treatment option.  If she desires to schedule ureteroscopy we can get a date and I will see her for preop visit.  If she elects observation would recommend a follow-up visit with KUB in 3 months.  Please let me know if she has any questions.

## 2019-03-14 NOTE — Telephone Encounter (Signed)
Patient notified she would like to observe, apt was made for 13mo w/KUB prior

## 2019-03-14 NOTE — Addendum Note (Signed)
Addended by: Tommy Rainwater on: 03/14/2019 04:12 PM   Modules accepted: Orders

## 2019-06-08 ENCOUNTER — Ambulatory Visit (INDEPENDENT_AMBULATORY_CARE_PROVIDER_SITE_OTHER): Payer: BC Managed Care – PPO | Admitting: Urology

## 2019-06-08 ENCOUNTER — Ambulatory Visit
Admission: RE | Admit: 2019-06-08 | Discharge: 2019-06-08 | Disposition: A | Discharge status: Home / Self Care | Payer: BC Managed Care – PPO | Source: Ambulatory Visit | Attending: Urology | Admitting: Urology

## 2019-06-08 ENCOUNTER — Other Ambulatory Visit: Payer: Self-pay

## 2019-06-08 VITALS — BP 144/86 | HR 76 | Ht 64.0 in | Wt 150.0 lb

## 2019-06-08 DIAGNOSIS — N200 Calculus of kidney: Secondary | ICD-10-CM

## 2019-06-08 DIAGNOSIS — R3129 Other microscopic hematuria: Secondary | ICD-10-CM

## 2019-06-08 LAB — URINALYSIS, COMPLETE
Bilirubin, UA: NEGATIVE
Glucose, UA: NEGATIVE
Ketones, UA: NEGATIVE
Nitrite, UA: NEGATIVE
Protein,UA: NEGATIVE
Specific Gravity, UA: 1.025 (ref 1.005–1.030)
Urobilinogen, Ur: 0.2 mg/dL (ref 0.2–1.0)
pH, UA: 7.5 (ref 5.0–7.5)

## 2019-06-08 LAB — MICROSCOPIC EXAMINATION
Bacteria, UA: NONE SEEN
Epithelial Cells (non renal): NONE SEEN /HPF (ref 0–10)

## 2019-06-09 ENCOUNTER — Encounter: Payer: Self-pay | Admitting: Urology

## 2019-06-09 ENCOUNTER — Telehealth: Payer: Self-pay

## 2019-06-09 NOTE — Telephone Encounter (Signed)
Per Dr. Bernardo Heater, "Please let patient know I would recommend a metabolic evaluation through [facility] to see if there is any reason we can identify why she is a stone former. Thanks". Notified patient as advised and went over the [facility] instructions. Patient verbalized understanding.

## 2019-06-09 NOTE — Progress Notes (Signed)
06/08/2019 8:39 AM   [name] 1955/08/29 [id number]  Referring provider: Marinda Elk, MD [facility] [place],  [place] [address]  Chief Complaint  Patient presents with  . Nephrolithiasis    HPI: 64 y.o. female presents for follow-up of nephrolithiasis  -CT 03/2019 with bilateral, nonobstructing renal calculi  L >R -Since last visit denies flank, abdominal pain or gross hematuria -KUB performed today reviewed and stable, bilateral renal calculi  PMH: Past Medical History:  Diagnosis Date  . Cataracts, bilateral    Hx: of  . Depression    Hx; of  . Glaucoma    Hx; of  . Hypertension   . Kidney stones    Hx: of  . Syncope    Hx; of one episode  . 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 CATARACT EXTRACTION W/PHACO Right 04/13/2013   Procedure: RIGHT CATARACT EXTRACTION PHACO AND INTRAOCULAR LENS PLACEMENT (IOC);  Surgeon: Marylynn Pearson, MD;  Location: [facility];  Service: Ophthalmology;  Laterality: Right;  . COLONOSCOPY     Hx; of  . CRYOABLATION     of cervix  . DILATION AND CURETTAGE OF UTERUS    . EYE SURGERY     Hx: of laser surgery  . KNEE ARTHROSCOPY     Hx: of right knee  . SHOULDER ARTHROSCOPY W/ ROTATOR CUFF REPAIR     Hx: of right shoulder  . TUBAL LIGATION      Home Medications:  Allergies as of 06/08/2019   No Known Allergies     Medication List       Accurate as of June 08, 2019 11:59 PM. If you have any questions, ask your nurse or doctor.        acetaminophen 500 MG tablet Commonly known as: TYLENOL Take 500 mg by mouth every 6 (six) hours as needed for mild pain.   aspirin EC 81 MG tablet Take 81 mg by mouth daily.   clobetasol ointment 0.05 % Commonly known as: TEMOVATE Apply topically.   Combigan 0.2-0.5 % ophthalmic solution Generic drug: brimonidine-timolol Place 1 drop into both eyes every 12 (twelve) hours.     cyanocobalamin 1000 MCG/ML injection Commonly known as: (VITAMIN B-12) Inject into the skin.   dorzolamide 2 % ophthalmic solution Commonly known as: TRUSOPT Apply to eye.   HAIR/SKIN/NAILS PO Take 3 tablets by mouth daily.   JUICE PLUS FIBRE PO Take 2 capsules by mouth daily.   losartan 25 MG tablet Commonly known as: COZAAR Take 25 mg by mouth daily.   losartan-hydrochlorothiazide 50-12.5 MG tablet Commonly known as: HYZAAR Take by mouth.   Lumigan 0.01 % Soln Generic drug: bimatoprost 1 drop at bedtime.   mometasone 0.1 % cream Commonly known as: ELOCON Apply to aa's bites QD-BID PRN.   Rhopressa 0.02 % Soln Generic drug: Netarsudil Dimesylate Apply 1 drop to eye at bedtime.   Travoprost (BAK Free) 0.004 % Soln ophthalmic solution Commonly known as: TRAVATAN Place 1 drop into both eyes at bedtime.   triamcinolone lotion 0.1 % Commonly known as: KENALOG APPLY TO AFFECTED AREA 3 TIMES A DAY       Allergies: No Known Allergies  Family History: Family History  Problem Relation Age of Onset  . Hypertension Mother   . Diabetes Mother   . Hypertension Father   . Glaucoma Father   . Cancer Other   .  Breast cancer Paternal Aunt   . Breast cancer Paternal Aunt     Social History:  reports that she has never smoked. She has never used smokeless tobacco. She reports current alcohol use. She reports that she does not use drugs.   Physical Exam: BP (!) 144/86   Pulse 76   Ht 5\' 4"  (1.626 m)   Wt 150 lb (68 kg)   BMI 25.75 kg/m   Constitutional:  Alert and oriented, No acute distress. HEENT: [place] AT, moist mucus membranes.  Trachea midline, no masses. Cardiovascular: No clubbing, cyanosis, or edema. Respiratory: Normal respiratory effort, no increased work of breathing. Neurologic: Grossly intact, no focal deficits, moving all 4 extremities. Psychiatric: Normal mood and affect.  Laboratory Data:  Urinalysis 3-10 RBCs  Pertinent Imaging: KUB  personally reviewed Results for orders placed during the hospital encounter of 06/08/19  Abdomen 1 view (KUB)   Narrative CLINICAL DATA:  Nephrolithiasis.  EXAM: ABDOMEN - 1 VIEW  COMPARISON:  CT dated March 11, 2019  FINDINGS: There multiple stones projecting over the lower pole the left kidney measuring up to approximately 8 mm. There is a 6 mm stone projecting over the lower pole the right kidney. There are calcifications in the patient's left hemipelvis that are consistent with phleboliths. The bowel gas pattern is nonobstructive.  IMPRESSION: Bilateral nephrolithiasis measuring up to 8 mm on the left and 6 mm on the right.   Electronically Signed   By: Constance Holster M.D.   On: 06/08/2019 22:50     Results for orders placed during the hospital encounter of 03/11/19  CT RENAL STONE STUDY   Narrative CLINICAL DATA:  Microhematuria. Left flank pain. Right lower quadrant pain. History of nephrolithiasis.  EXAM: CT ABDOMEN AND PELVIS WITHOUT CONTRAST  TECHNIQUE: Multidetector CT imaging of the abdomen and pelvis was performed following the standard protocol without IV contrast.  COMPARISON:  04/14/2016 CT abdomen/pelvis.  FINDINGS: Lower chest: No significant pulmonary nodules or acute consolidative airspace disease.  Hepatobiliary: Normal liver size. No liver mass. Normal gallbladder with no radiopaque cholelithiasis. No biliary ductal dilatation.  Pancreas: Normal, with no mass or duct dilation.  Spleen: Normal size. No mass.  Adrenals/Urinary Tract: Normal adrenals. Nonobstructing 4 mm lower right renal stone. No additional right renal stones. 4 clustered nonobstructing lower left renal stones, largest 7 mm and 8 mm. No hydronephrosis. Simple 3.3 cm lower right renal cyst. No contour deforming renal masses. Normal caliber ureters. No ureteral stones. Normal nondistended bladder with no bladder stones.  Stomach/Bowel: Normal non-distended stomach.  Normal caliber small bowel with no small bowel wall thickening. Normal appendix is identified. Mild sigmoid diverticulosis with no large bowel wall thickening or significant pericolonic fat stranding.  Vascular/Lymphatic: Atherosclerotic nonaneurysmal abdominal aorta. No pathologically enlarged lymph nodes in the abdomen or pelvis.  Reproductive: Simple 1.2 cm right adnexal cyst (series 2/image 51), stable since 04/14/2016 CT, considered benign. No left adnexal masses. Normal uterus.  Other: No pneumoperitoneum, ascites or focal fluid collection.  Musculoskeletal: No aggressive appearing focal osseous lesions.  IMPRESSION: 1. Nonobstructing bilateral nephrolithiasis. No hydronephrosis. No ureteral or bladder stones. 2. Mild sigmoid diverticulosis. 3.  Aortic Atherosclerosis (ICD10-I70.0).   Electronically Signed   By: Ilona Sorrel M.D.   On: 03/12/2019 12:01     Assessment & Plan:    - Nephrolithiasis Stable, bilateral nonobstructing renal calculi.  I again discussed options of observation versus ureteroscopy.  She has elected surveillance and will follow up in 6 months with a  KUB.  Recommend metabolic evaluation.   Abbie Sons, [place] [address] [place], [place] [address] [phone]

## 2019-07-26 ENCOUNTER — Other Ambulatory Visit: Payer: Self-pay | Admitting: Urology

## 2019-07-28 ENCOUNTER — Telehealth: Payer: Self-pay | Admitting: Urology

## 2019-07-28 NOTE — Telephone Encounter (Signed)
24-hour urine study showed low urine volume at 460 mL.  Recommend increasing her water intake to keep urine output >2000 mL.  Typically 3 quarts of water per day will produce this output.  Her urine citrate was also low and recommend LithoLyte twice daily

## 2019-07-29 NOTE — Telephone Encounter (Signed)
Patient notified and voiced understanding. LythoLyte samples were placed up front for patient to pick up.

## 2019-09-21 ENCOUNTER — Ambulatory Visit: Payer: BC Managed Care – PPO | Admitting: Dermatology

## 2019-10-05 ENCOUNTER — Ambulatory Visit (INDEPENDENT_AMBULATORY_CARE_PROVIDER_SITE_OTHER): Payer: BC Managed Care – PPO | Admitting: Dermatology

## 2019-10-05 ENCOUNTER — Other Ambulatory Visit: Payer: Self-pay

## 2019-10-05 DIAGNOSIS — L821 Other seborrheic keratosis: Secondary | ICD-10-CM | POA: Diagnosis not present

## 2019-10-05 DIAGNOSIS — L739 Follicular disorder, unspecified: Secondary | ICD-10-CM

## 2019-10-05 DIAGNOSIS — B079 Viral wart, unspecified: Secondary | ICD-10-CM | POA: Diagnosis not present

## 2019-10-05 NOTE — Progress Notes (Signed)
   Follow-Up Visit   Subjective  Zoe [name] is a 64 y.o. female who presents for the following: growth (Right scalp x 3 weeks, sore. Similar spot on left scalp patient noticed last night.) and growth (Right neck, noticed a few weeks ago.).   The following portions of the chart were reviewed this encounter and updated as appropriate:      Review of Systems:  No other skin or systemic complaints except as noted in HPI or Assessment and Plan.  Objective  Well appearing patient in no apparent distress; mood and affect are within normal limits.  A focused examination was performed including scalp. Relevant physical exam findings are noted in the Assessment and Plan.  Objective  Right Temporal Scalp x 1, Right Upper Neck x 1 (2): Filiform waxy papules, 3.77mm   Objective  Left Occipital Hairline: Follicular-based light pink papule.   Assessment & Plan    Seborrheic Keratoses - Stuck-on, waxy, tan-brown papules and plaques  - Discussed benign etiology and prognosis. - Observe - Call for any changes    Viral warts, unspecified type (2) Right Temporal Scalp x 1, Right Upper Neck x 1  vs ISK  Discussed risk of hypopigmentation at site of LN2 treatment. May need more than one treatment to clear.  Destruction of lesion - Right Temporal Scalp x 1, Right Upper Neck x 1  Destruction method: cryotherapy   Informed consent: discussed and consent obtained   Lesion destroyed using liquid nitrogen: Yes   Region frozen until ice ball extended beyond lesion: Yes   Outcome: patient tolerated procedure well with no complications   Post-procedure details: wound care instructions given    Folliculitis Left Occipital Hairline  Benign Start Neosporin QD until improved.  If patient continues to get similar spots form, will send in topical Rx.  Return if symptoms worsen or fail to improve.   IJamesetta Orleans, CMA, am acting as scribe for Brendolyn Patty, MD .  Documentation: I  have reviewed the above documentation for accuracy and completeness, and I agree with the above.  Brendolyn Patty MD

## 2019-10-05 NOTE — Patient Instructions (Signed)
Cryotherapy Aftercare  . Wash gently with soap and water everyday.   . Apply Vaseline and Band-Aid daily until healed.  

## 2019-11-15 ENCOUNTER — Other Ambulatory Visit: Payer: Self-pay

## 2019-11-15 ENCOUNTER — Encounter (INDEPENDENT_AMBULATORY_CARE_PROVIDER_SITE_OTHER): Payer: Self-pay | Admitting: Ophthalmology

## 2019-11-15 ENCOUNTER — Encounter (INDEPENDENT_AMBULATORY_CARE_PROVIDER_SITE_OTHER): Payer: BC Managed Care – PPO | Admitting: Ophthalmology

## 2019-11-15 ENCOUNTER — Ambulatory Visit (INDEPENDENT_AMBULATORY_CARE_PROVIDER_SITE_OTHER): Payer: BC Managed Care – PPO | Admitting: Ophthalmology

## 2019-11-15 DIAGNOSIS — H35412 Lattice degeneration of retina, left eye: Secondary | ICD-10-CM

## 2019-11-15 DIAGNOSIS — T85398A Other mechanical complication of other ocular prosthetic devices, implants and grafts, initial encounter: Secondary | ICD-10-CM

## 2019-11-15 DIAGNOSIS — T85398S Other mechanical complication of other ocular prosthetic devices, implants and grafts, sequela: Secondary | ICD-10-CM

## 2019-11-15 DIAGNOSIS — Z8669 Personal history of other diseases of the nervous system and sense organs: Secondary | ICD-10-CM | POA: Diagnosis not present

## 2019-11-15 DIAGNOSIS — H35341 Macular cyst, hole, or pseudohole, right eye: Secondary | ICD-10-CM | POA: Diagnosis not present

## 2019-11-15 DIAGNOSIS — H35072 Retinal telangiectasis, left eye: Secondary | ICD-10-CM | POA: Insufficient documentation

## 2019-11-15 HISTORY — DX: Lattice degeneration of retina, left eye: H35.412

## 2019-11-15 HISTORY — DX: Other mechanical complication of other ocular prosthetic devices, implants and grafts, initial encounter: T85.398A

## 2019-11-15 HISTORY — DX: Retinal telangiectasis, left eye: H35.072

## 2019-11-15 NOTE — Assessment & Plan Note (Signed)
No new retinal breaks 

## 2019-11-15 NOTE — Assessment & Plan Note (Signed)
Condition resolved, no current exposed buckle

## 2019-11-15 NOTE — Assessment & Plan Note (Signed)
Change over time, will observe

## 2019-11-15 NOTE — Progress Notes (Signed)
11/15/2019     CHIEF COMPLAINT Patient presents for Retina Follow Up   HISTORY OF PRESENT ILLNESS: Zoe [name] is a 64 y.o. female who presents to the clinic today for:   HPI    Retina Follow Up    Patient presents with  Other.  In both eyes.  This started 6 months ago.  Severity is mild.  Duration of 6 months.  Since onset it is stable.          Comments    6 Month F/U OU  Pt reports blur OU, OD>OS x 2 weeks approx. Pt sts OD stays red most of the time. Pt c/o occasional sharp pains OD that come and go quickly. Pt reports occasional fb sensation OU.       Last edited by Rockie Neighbours, [place] on 11/15/2019  3:54 PM. (History)      Referring physician: Marinda Elk, MD [facility],  [place] [address]  HISTORICAL INFORMATION:   Selected notes from the MEDICAL RECORD NUMBER       CURRENT MEDICATIONS: Current Outpatient Medications (Ophthalmic Drugs)  Medication Sig  . brimonidine-timolol (COMBIGAN) 0.2-0.5 % ophthalmic solution Place 1 drop into both eyes every 12 (twelve) hours.  . dorzolamide (TRUSOPT) 2 % ophthalmic solution Apply to eye.  Marland Kitchen LUMIGAN 0.01 % SOLN 1 drop at bedtime.  . RHOPRESSA 0.02 % SOLN Apply 1 drop to eye at bedtime.  . Travoprost, BAK Free, (TRAVATAN) 0.004 % SOLN ophthalmic solution Place 1 drop into both eyes at bedtime. (Patient not taking: Reported on 10/05/2019)   No current facility-administered medications for this visit. (Ophthalmic Drugs)   Current Outpatient Medications (Other)  Medication Sig  . acetaminophen (TYLENOL) 500 MG tablet Take 500 mg by mouth every 6 (six) hours as needed for mild pain.  Marland Kitchen aspirin EC 81 MG tablet Take 81 mg by mouth daily.  . cyanocobalamin (,VITAMIN B-12,) 1000 MCG/ML injection Inject into the skin.  Marland Kitchen losartan (COZAAR) 25 MG tablet Take 25 mg by mouth daily.  Marland Kitchen losartan-hydrochlorothiazide (HYZAAR) 50-12.5 MG tablet Take by mouth. (Patient not taking: Reported  on 10/05/2019)  . Multiple Vitamins-Minerals (HAIR/SKIN/NAILS PO) Take 3 tablets by mouth daily.  . Nutritional Supplements (JUICE PLUS FIBRE PO) Take 2 capsules by mouth daily.   No current facility-administered medications for this visit. (Other)      REVIEW OF SYSTEMS:    ALLERGIES No Known Allergies  PAST MEDICAL HISTORY Past Medical History:  Diagnosis Date  . Cataracts, bilateral    Hx: of  . Depression    Hx; of  . Glaucoma    Hx; of  . Hypertension   . Kidney stones    Hx: of  . Syncope    Hx; of one episode  . Umbilical hernia    at birth   Past Surgical History:  Procedure Laterality Date  . CATARACT EXTRACTION W/PHACO Right 04/13/2013   Procedure: RIGHT CATARACT EXTRACTION PHACO AND INTRAOCULAR LENS PLACEMENT (IOC);  Surgeon: Marylynn Pearson, MD;  Location: [facility];  Service: Ophthalmology;  Laterality: Right;  . COLONOSCOPY     Hx; of  . CRYOABLATION     of cervix  . DILATION AND CURETTAGE OF UTERUS    . EYE SURGERY     Hx: of laser surgery  . KNEE ARTHROSCOPY     Hx: of right knee  . SHOULDER ARTHROSCOPY W/ ROTATOR CUFF REPAIR     Hx: of right shoulder  .  TUBAL LIGATION      FAMILY HISTORY Family History  Problem Relation Age of Onset  . Hypertension Mother   . Diabetes Mother   . Hypertension Father   . Glaucoma Father   . Cancer Other   . Breast cancer Paternal Aunt   . Breast cancer Paternal Aunt     SOCIAL HISTORY Social History   Tobacco Use  . Smoking status: Never Smoker  . Smokeless tobacco: Never Used  Substance Use Topics  . Alcohol use: Yes    Comment: occasional wine  . Drug use: No         OPHTHALMIC EXAM:  Base Eye Exam    Visual Acuity (ETDRS)      Right Left   Dist cc 20/30 +2 20/30 +2   Dist ph cc 20/20 -2 20/25 +2   Correction: Glasses       Tonometry (Tonopen, 4:01 PM)      Right Left   Pressure 11 11       Pupils      Pupils Dark Light Shape React APD   Right PERRL 5 4 Round Brisk None   Left  PERRL 5 4 Round Brisk None       Visual Fields (Counting fingers)      Left Right    Full Full       Extraocular Movement      Right Left    Full Full       Neuro/Psych    Oriented x3: Yes   Mood/Affect: Normal       Dilation    Both eyes: 1.0% Mydriacyl, 2.5% Phenylephrine @ 4:01 PM        Slit Lamp and Fundus Exam    External Exam      Right Left   External Normal Normal       Slit Lamp Exam      Right Left   Lids/Lashes Normal Normal   Conjunctiva/Sclera White and quiet White and quiet   Cornea Clear Clear   Anterior Chamber Deep and quiet Deep and quiet   Iris Round and reactive Round and reactive   Anterior Vitreous Normal Normal       Fundus Exam      Right Left   Posterior Vitreous Clear vitrectomized Clear vitrectomized   Disc Normal Normal   C/D Ratio 0.55 0.55   Macula Normal Normal   Vessels Normal Normal   Periphery Retina attached, chorioretinal scar nearly 360 good buckle effect Retinopexy superiorly, old lattice degeneration nearly 360          IMAGING AND PROCEDURES  Imaging and Procedures for 11/15/19  OCT, Retina - OU - Both Eyes       Right Eye Quality was good. Scan locations included subfoveal. Central Foveal Thickness: 212. Progression has been stable.   Left Eye Quality was good. Scan locations included subfoveal. Central Foveal Thickness: 223. Progression has been stable. Findings include vitreomacular adhesion , normal observations.   Notes Irregular foveal contour, post retinal detachment likely no active maculopathy                ASSESSMENT/PLAN:  Lamellar macular hole of right eye Change over time, will observe  Extrusion of scleral buckle Condition resolved, no current exposed buckle  Lattice degeneration, left eye No new retinal breaks      ICD-10-CM   1. 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 Extrusion of scleral buckle, sequela  T85.398S   5. History of retinal detachment  Z86.69     1.  2.  3.  Ophthalmic Meds Ordered this visit:  No orders of the defined types were placed in this encounter.      Return in about 6 months (around 05/14/2020) for DILATE OU, OCT.  There are no Patient Instructions on file for this visit.   Explained the diagnoses, plan, and follow up with the patient and they expressed understanding.  Patient expressed understanding of the importance of proper follow up care.   Clent Demark Traevion Poehler M.D. Diseases & Surgery of the Retina and Vitreous Retina [facility] Diabetic [place] 11/15/19     Abbreviations: M myopia (nearsighted); A astigmatism; H hyperopia (farsighted); P presbyopia; Mrx spectacle prescription;  CTL contact lenses; OD right eye; OS left eye; OU both eyes  XT exotropia; ET esotropia; PEK punctate epithelial keratitis; PEE punctate epithelial erosions; DES dry eye syndrome; MGD meibomian gland dysfunction; ATs artificial tears; PFAT's preservative free artificial tears; [place] nuclear sclerotic cataract; PSC posterior subcapsular cataract; ERM epi-retinal membrane; PVD posterior vitreous detachment; RD retinal detachment; DM diabetes mellitus; DR diabetic retinopathy; NPDR non-proliferative diabetic retinopathy; PDR proliferative diabetic retinopathy; CSME clinically significant macular edema; DME diabetic macular edema; dbh dot blot hemorrhages; CWS cotton wool spot; POAG primary open angle glaucoma; C/D cup-to-disc ratio; HVF humphrey visual field; GVF goldmann visual field; OCT optical coherence tomography; IOP intraocular pressure; BRVO Branch retinal vein occlusion; CRVO central retinal vein occlusion; CRAO central retinal artery occlusion; BRAO branch retinal artery occlusion; RT retinal tear; SB scleral buckle; PPV pars plana vitrectomy; VH Vitreous hemorrhage; PRP panretinal laser photocoagulation; IVK  intravitreal kenalog; VMT vitreomacular traction; MH Macular hole;  NVD neovascularization of the disc; NVE neovascularization elsewhere; AREDS age related eye disease study; ARMD age related macular degeneration; POAG primary open angle glaucoma; EBMD epithelial/anterior basement membrane dystrophy; ACIOL anterior chamber intraocular lens; IOL intraocular lens; PCIOL posterior chamber intraocular lens; Phaco/IOL phacoemulsification with intraocular lens placement; [place] photorefractive keratectomy; LASIK laser assisted in situ keratomileusis; HTN hypertension; DM diabetes mellitus; COPD chronic obstructive pulmonary disease

## 2019-12-08 ENCOUNTER — Other Ambulatory Visit: Payer: Self-pay | Admitting: *Deleted

## 2019-12-08 DIAGNOSIS — N200 Calculus of kidney: Secondary | ICD-10-CM

## 2019-12-08 NOTE — Progress Notes (Signed)
12/09/2019 2:18 PM   [name] [birth year] [id number]  Referring provider: Marinda Elk, MD [facility],  [place] [address] Chief Complaint  Patient presents with  . Nephrolithiasis    HPI: Zoe [name] is a 64 y.o. female who presents today for a 6 month follow up of nephrolithiasis   -CT 03/2019 with bilateral, nonobstructing renal calculi  L >R -Last visit she denied flank, abdominal pain or gross hematuria -KUB on 06/08/2019 revealed stable, bilateral renal calculi -Today she denies any recent stone episodes. -No flank, abdomina or pelvic pain. No hematuria.   Metabolic 24 hour urine work up from 07/25/2019 was remarkable for low urine volume and hypocitraturia.    PMH: Past Medical History:  Diagnosis Date  . Cataracts, bilateral    Hx: of  . Depression    Hx; of  . Glaucoma    Hx; of  . Hypertension   . Kidney stones    Hx: of  . Syncope    Hx; of one episode  . 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 CATARACT EXTRACTION W/PHACO Right 04/13/2013   Procedure: RIGHT CATARACT EXTRACTION PHACO AND INTRAOCULAR LENS PLACEMENT (IOC);  Surgeon: Marylynn Pearson, MD;  Location: [facility];  Service: Ophthalmology;  Laterality: Right;  . COLONOSCOPY     Hx; of  . CRYOABLATION     of cervix  . DILATION AND CURETTAGE OF UTERUS    . EYE SURGERY     Hx: of laser surgery  . KNEE ARTHROSCOPY     Hx: of right knee  . SHOULDER ARTHROSCOPY W/ ROTATOR CUFF REPAIR     Hx: of right shoulder  . TUBAL LIGATION      Home Medications:  Allergies as of 12/09/2019   No Known Allergies     Medication List       Accurate as of December 09, 2019  2:18 PM. If you have any questions, ask your nurse or doctor.        acetaminophen 500 MG tablet Commonly known as: TYLENOL Take 500 mg by mouth every 6 (six) hours as needed for mild pain.   aspirin EC 81 MG  tablet Take 81 mg by mouth daily.   Combigan 0.2-0.5 % ophthalmic solution Generic drug: brimonidine-timolol Place 1 drop into both eyes every 12 (twelve) hours.   cyanocobalamin 1000 MCG/ML injection Commonly known as: (VITAMIN B-12) Inject into the skin.   dorzolamide 2 % ophthalmic solution Commonly known as: TRUSOPT Apply to eye.   HAIR/SKIN/NAILS PO Take 3 tablets by mouth daily.   JUICE PLUS FIBRE PO Take 2 capsules by mouth daily.   losartan 25 MG tablet Commonly known as: COZAAR Take 25 mg by mouth daily.   losartan-hydrochlorothiazide 50-12.5 MG tablet Commonly known as: HYZAAR Take by mouth.   Lumigan 0.01 % Soln Generic drug: bimatoprost 1 drop at bedtime.   Rhopressa 0.02 % Soln Generic drug: Netarsudil Dimesylate Apply 1 drop to eye at bedtime.   Travoprost (BAK Free) 0.004 % Soln ophthalmic solution Commonly known as: TRAVATAN Place 1 drop into both eyes at bedtime.       Allergies: No Known Allergies  Family History: Family History  Problem Relation Age of Onset  . Hypertension Mother   . Diabetes Mother   . Hypertension Father   . Glaucoma Father   . Cancer Other   . Breast  cancer Paternal Aunt   . Breast cancer Paternal Aunt     Social History:  reports that she has never smoked. She has never used smokeless tobacco. She reports current alcohol use. She reports that she does not use drugs.   Physical Exam: BP (!) 146/82   Ht 5\' 4"  (1.626 m)   Wt 153 lb (69.4 kg)   BMI 26.26 kg/m   Constitutional:  Alert and oriented, No acute distress. HEENT: [place] AT, moist mucus membranes.  Trachea midline, no masses. Cardiovascular: No clubbing, cyanosis, or edema. Respiratory: Normal respiratory effort, no increased work of breathing. Skin: No rashes, bruises or suspicious lesions. Neurologic: Grossly intact, no focal deficits, moving all 4 extremities. Psychiatric: Normal mood and affect.    Assessment & Plan:    1. Nephrolithiasis   Metabolic 24 hour urine work up from 07/25/2019 was remarkable for low urine volume and hypocitraturia.   We discussed general stone prevention techniques including drinking plenty water with goal of producing 2.5 L urine daily, increased citric acid intake, drink litholyte, avoidance of high oxalate containing foods, and decreased salt intake.  Information about dietary recommendations given today.   KUB ordered today and she will be notified with the results.  Follow up in 1 year with KUB.    [facility] [address] [place], [place] [address] [phone]  I, [name], am acting as a scribe for Dr. Nicki Reaper C. Kire Ferg,  I have reviewed the above documentation for accuracy and completeness, and I agree with the above.   Abbie Sons, MD

## 2019-12-09 ENCOUNTER — Other Ambulatory Visit: Payer: Self-pay

## 2019-12-09 ENCOUNTER — Ambulatory Visit
Admission: RE | Admit: 2019-12-09 | Discharge: 2019-12-09 | Disposition: A | Discharge status: Home / Self Care | Payer: BC Managed Care – PPO | Source: Ambulatory Visit | Attending: Urology | Admitting: Urology

## 2019-12-09 ENCOUNTER — Ambulatory Visit (INDEPENDENT_AMBULATORY_CARE_PROVIDER_SITE_OTHER): Payer: BC Managed Care – PPO | Admitting: Urology

## 2019-12-09 ENCOUNTER — Encounter: Payer: Self-pay | Admitting: Urology

## 2019-12-09 VITALS — BP 146/82 | Ht 64.0 in | Wt 153.0 lb

## 2019-12-09 DIAGNOSIS — R82991 Hypocitraturia: Secondary | ICD-10-CM

## 2019-12-09 DIAGNOSIS — N200 Calculus of kidney: Secondary | ICD-10-CM

## 2019-12-11 ENCOUNTER — Encounter: Payer: Self-pay | Admitting: Urology

## 2019-12-11 DIAGNOSIS — R82991 Hypocitraturia: Secondary | ICD-10-CM | POA: Insufficient documentation

## 2019-12-15 ENCOUNTER — Telehealth: Payer: Self-pay | Admitting: Family Medicine

## 2019-12-15 NOTE — Telephone Encounter (Signed)
-----   Message from Abbie Sons, MD sent at 12/15/2019 12:52 PM EDT ----- KUB showed stable left renal calculi.  Follow-up as scheduled

## 2019-12-15 NOTE — Telephone Encounter (Signed)
Patient notified and voiced understanding.

## 2020-01-06 HISTORY — PX: COLONOSCOPY: SHX174

## 2020-02-14 ENCOUNTER — Other Ambulatory Visit: Payer: Self-pay | Admitting: Physician Assistant

## 2020-02-15 ENCOUNTER — Other Ambulatory Visit: Payer: Self-pay | Admitting: Physician Assistant

## 2020-02-15 DIAGNOSIS — Z1231 Encounter for screening mammogram for malignant neoplasm of breast: Secondary | ICD-10-CM

## 2020-04-04 ENCOUNTER — Other Ambulatory Visit: Payer: Self-pay

## 2020-04-04 ENCOUNTER — Ambulatory Visit
Admission: RE | Admit: 2020-04-04 | Discharge: 2020-04-04 | Disposition: A | Discharge status: Home / Self Care | Payer: BC Managed Care – PPO | Source: Ambulatory Visit | Attending: Physician Assistant | Admitting: Physician Assistant

## 2020-04-04 DIAGNOSIS — Z1231 Encounter for screening mammogram for malignant neoplasm of breast: Secondary | ICD-10-CM | POA: Diagnosis present

## 2020-04-19 ENCOUNTER — Encounter (INDEPENDENT_AMBULATORY_CARE_PROVIDER_SITE_OTHER): Payer: BC Managed Care – PPO | Admitting: Ophthalmology

## 2020-05-01 ENCOUNTER — Ambulatory Visit (INDEPENDENT_AMBULATORY_CARE_PROVIDER_SITE_OTHER): Payer: BC Managed Care – PPO | Admitting: Ophthalmology

## 2020-05-01 ENCOUNTER — Encounter (INDEPENDENT_AMBULATORY_CARE_PROVIDER_SITE_OTHER): Payer: Self-pay | Admitting: Ophthalmology

## 2020-05-01 ENCOUNTER — Other Ambulatory Visit: Payer: Self-pay

## 2020-05-01 DIAGNOSIS — H35341 Macular cyst, hole, or pseudohole, right eye: Secondary | ICD-10-CM

## 2020-05-01 DIAGNOSIS — T85398D Other mechanical complication of other ocular prosthetic devices, implants and grafts, subsequent encounter: Secondary | ICD-10-CM

## 2020-05-01 DIAGNOSIS — Z8669 Personal history of other diseases of the nervous system and sense organs: Secondary | ICD-10-CM

## 2020-05-01 DIAGNOSIS — H35412 Lattice degeneration of retina, left eye: Secondary | ICD-10-CM | POA: Diagnosis not present

## 2020-05-01 NOTE — Assessment & Plan Note (Signed)
No active disease, no new breaks stable status post retinopexy

## 2020-05-01 NOTE — Assessment & Plan Note (Signed)
Condition resolved 

## 2020-05-01 NOTE — Progress Notes (Signed)
05/01/2020     CHIEF COMPLAINT Patient presents for Retina Follow Up (6 Month f\u OU. OCT/Pt states it frequently feels like something (a hair) is in OD. Pt states OD will occasionally have sharp pains and burn. Pt has had some blurry vision but overall vision is doing well.)   HISTORY OF PRESENT ILLNESS: Zoe [name] is a 65 y.o. female who presents to the clinic today for:   HPI    Retina Follow Up    Patient presents with  Retinal Break/Detachment.  In right eye.  Severity is moderate.  Duration of 6 months.  Since onset it is stable.  I, the attending physician,  performed the HPI with the patient and updated documentation appropriately. Additional comments: 6 Month f\u OU. OCT Pt states it frequently feels like something (a hair) is in OD. Pt states OD will occasionally have sharp pains and burn. Pt has had some blurry vision but overall vision is doing well.       Last edited by Tilda Franco on 05/01/2020  2:39 PM. (History)      Referring physician: Marinda Elk, MD [facility] [place],  [place] [address]  HISTORICAL INFORMATION:   Selected notes from the MEDICAL RECORD NUMBER       CURRENT MEDICATIONS: Current Outpatient Medications (Ophthalmic Drugs)  Medication Sig  . brimonidine-timolol (COMBIGAN) 0.2-0.5 % ophthalmic solution Place 1 drop into both eyes every 12 (twelve) hours.  . dorzolamide (TRUSOPT) 2 % ophthalmic solution Apply to eye.  Marland Kitchen LUMIGAN 0.01 % SOLN 1 drop at bedtime.  . RHOPRESSA 0.02 % SOLN Apply 1 drop to eye at bedtime.  . Travoprost, BAK Free, (TRAVATAN) 0.004 % SOLN ophthalmic solution Place 1 drop into both eyes at bedtime. (Patient not taking: Reported on 10/05/2019)   No current facility-administered medications for this visit. (Ophthalmic Drugs)   Current Outpatient Medications (Other)  Medication Sig  . acetaminophen (TYLENOL) 500 MG tablet Take 500 mg by mouth every 6 (six) hours as needed for  mild pain.  Marland Kitchen aspirin EC 81 MG tablet Take 81 mg by mouth daily.  . cyanocobalamin (,VITAMIN B-12,) 1000 MCG/ML injection Inject into the skin.  Marland Kitchen losartan (COZAAR) 25 MG tablet Take 25 mg by mouth daily.  Marland Kitchen losartan-hydrochlorothiazide (HYZAAR) 50-12.5 MG tablet Take by mouth. (Patient not taking: Reported on 10/05/2019)  . Multiple Vitamins-Minerals (HAIR/SKIN/NAILS PO) Take 3 tablets by mouth daily.  . Nutritional Supplements (JUICE PLUS FIBRE PO) Take 2 capsules by mouth daily.   No current facility-administered medications for this visit. (Other)      REVIEW OF SYSTEMS:    ALLERGIES No Known Allergies  PAST MEDICAL HISTORY Past Medical History:  Diagnosis Date  . Cataracts, bilateral    Hx: of  . Depression    Hx; of  . Extrusion of scleral buckle 11/15/2019   Exposed stitch in the past superonasal quadrant, resected  . Glaucoma    Hx; of  . Hypertension   . Kidney stones    Hx: of  . Syncope    Hx; of one episode  . Umbilical hernia    at birth   Past Surgical History:  Procedure Laterality Date  . CATARACT EXTRACTION W/PHACO Right 04/13/2013   Procedure: RIGHT CATARACT EXTRACTION PHACO AND INTRAOCULAR LENS PLACEMENT (IOC);  Surgeon: Marylynn Pearson, MD;  Location: [facility];  Service: Ophthalmology;  Laterality: Right;  . COLONOSCOPY     Hx; of  . CRYOABLATION  of cervix  . DILATION AND CURETTAGE OF UTERUS    . EYE SURGERY     Hx: of laser surgery  . KNEE ARTHROSCOPY     Hx: of right knee  . SHOULDER ARTHROSCOPY W/ ROTATOR CUFF REPAIR     Hx: of right shoulder  . TUBAL LIGATION      FAMILY HISTORY Family History  Problem Relation Age of Onset  . Hypertension Mother   . Diabetes Mother   . Hypertension Father   . Glaucoma Father   . Cancer Other   . Breast cancer Paternal Aunt   . Breast cancer Paternal Aunt     SOCIAL HISTORY Social History   Tobacco Use  . Smoking status: Never Smoker  . Smokeless tobacco: Never Used  Substance Use Topics  .  Alcohol use: Yes    Comment: occasional wine  . Drug use: No         OPHTHALMIC EXAM:  Base Eye Exam    Visual Acuity (Snellen - Linear)      Right Left   Dist cc 20/30 +1 20/30 +2   Correction: Glasses       Tonometry (Tonopen, 2:46 PM)      Right Left   Pressure 9 9       Pupils      Pupils Dark Light Shape React APD   Right PERRL 4 3 Round Brisk None   Left PERRL 4 3 Round Brisk None       Visual Fields (Counting fingers)      Left Right    Full Full       Neuro/Psych    Oriented x3: Yes   Mood/Affect: Normal       Dilation    Both eyes: 1.0% Mydriacyl, 2.5% Phenylephrine @ 2:46 PM        Slit Lamp and Fundus Exam    External Exam      Right Left   External Normal Normal       Slit Lamp Exam      Right Left   Lids/Lashes Normal Normal   Conjunctiva/Sclera White and quiet, no exposed sutures.  Buccal covered well with conjunctiva 360 White and quiet   Cornea Clear Clear   Anterior Chamber Deep and quiet Deep and quiet   Iris Round and reactive Round and reactive   Lens Centered posterior chamber intraocular lens Centered posterior chamber intraocular lens   Anterior Vitreous Normal Normal       Fundus Exam      Right Left   Posterior Vitreous Clear vitrectomized Central vitreous floaters, Posterior vitreous detachment   Disc Normal Normal   C/D Ratio 0.55 0.55   Macula Normal Normal   Vessels Normal Normal   Periphery Retina attached, chorioretinal scar nearly 360 good buckle effect Retinopexy superiorly, old lattice degeneration nearly 360, retinopexy inferiorly as well          IMAGING AND PROCEDURES  Imaging and Procedures for 05/01/20  OCT, Retina - OU - Both Eyes       Right Eye Quality was good. Scan locations included subfoveal. Central Foveal Thickness: 214. Progression has been stable. Findings include abnormal foveal contour.   Left Eye Quality was good. Scan locations included subfoveal. Central Foveal Thickness: 231.  Progression has been stable. Findings include vitreomacular adhesion , normal observations.   Notes Irregular foveal contour, post retinal detachment likely no active maculopathy OD, lamellar macular hole, not active  ASSESSMENT/PLAN:  Extrusion of scleral buckle Condition resolved  Lattice degeneration, left eye No active disease, no new breaks stable status post retinopexy  Lamellar macular hole of right eye Irregular foveal contour with intact outer retina thus not a full-thickness macular hole observation alone is the therapy of choice      ICD-10-CM   1. Lamellar macular hole of right eye  H35.341 OCT, Retina - OU - Both Eyes  2. History of retinal detachment  Z86.69   3. Extrusion of scleral buckle, subsequent encounter  T85.398D   4. Lattice degeneration, left eye  H35.412     1.  OS, stable with old lattice degeneration status post retinopexy with fellow eye detachment  2.  Regular foveal contour right eye lamellar macular hole yet with good acuity will observe  3.  Ophthalmic Meds Ordered this visit:  No orders of the defined types were placed in this encounter.      Return in about 1 year (around 05/01/2021) for DILATE OU, COLOR FP.  There are no Patient Instructions on file for this visit.   Explained the diagnoses, plan, and follow up with the patient and they expressed understanding.  Patient expressed understanding of the importance of proper follow up care.   Zoe [name] M.D. Diseases & Surgery of the Retina and Vitreous [facility] [place] 05/01/20     Abbreviations: M myopia (nearsighted); A astigmatism; H hyperopia (farsighted); P presbyopia; Mrx spectacle prescription;  CTL contact lenses; OD right eye; OS left eye; OU both eyes  XT exotropia; ET esotropia; PEK punctate epithelial keratitis; PEE punctate epithelial erosions; DES dry eye syndrome; MGD meibomian gland dysfunction; ATs artificial tears; PFAT's  preservative free artificial tears; [place] nuclear sclerotic cataract; PSC posterior subcapsular cataract; ERM epi-retinal membrane; PVD posterior vitreous detachment; RD retinal detachment; DM diabetes mellitus; DR diabetic retinopathy; NPDR non-proliferative diabetic retinopathy; PDR proliferative diabetic retinopathy; CSME clinically significant macular edema; DME diabetic macular edema; dbh dot blot hemorrhages; CWS cotton wool spot; POAG primary open angle glaucoma; C/D cup-to-disc ratio; HVF humphrey visual field; GVF goldmann visual field; OCT optical coherence tomography; IOP intraocular pressure; BRVO Branch retinal vein occlusion; CRVO central retinal vein occlusion; CRAO central retinal artery occlusion; BRAO branch retinal artery occlusion; RT retinal tear; SB scleral buckle; PPV pars plana vitrectomy; VH Vitreous hemorrhage; PRP panretinal laser photocoagulation; IVK intravitreal kenalog; VMT vitreomacular traction; MH Macular hole;  NVD neovascularization of the disc; NVE neovascularization elsewhere; AREDS age related eye disease study; ARMD age related macular degeneration; POAG primary open angle glaucoma; EBMD epithelial/anterior basement membrane dystrophy; ACIOL anterior chamber intraocular lens; IOL intraocular lens; PCIOL posterior chamber intraocular lens; Phaco/IOL phacoemulsification with intraocular lens placement; [place] photorefractive keratectomy; LASIK laser assisted in situ keratomileusis; HTN hypertension; DM diabetes mellitus; COPD chronic obstructive pulmonary disease

## 2020-05-01 NOTE — Assessment & Plan Note (Signed)
Irregular foveal contour with intact outer retina thus not a full-thickness macular hole observation alone is the therapy of choice

## 2020-12-11 ENCOUNTER — Other Ambulatory Visit: Payer: Self-pay | Admitting: *Deleted

## 2020-12-11 DIAGNOSIS — N200 Calculus of kidney: Secondary | ICD-10-CM

## 2020-12-12 ENCOUNTER — Ambulatory Visit
Admission: RE | Admit: 2020-12-12 | Discharge: 2020-12-12 | Disposition: A | Discharge status: Home / Self Care | Payer: BC Managed Care – PPO | Source: Ambulatory Visit | Attending: Urology | Admitting: Urology

## 2020-12-12 ENCOUNTER — Other Ambulatory Visit: Payer: Self-pay

## 2020-12-12 ENCOUNTER — Encounter: Payer: Self-pay | Admitting: Urology

## 2020-12-12 ENCOUNTER — Ambulatory Visit (INDEPENDENT_AMBULATORY_CARE_PROVIDER_SITE_OTHER): Payer: BC Managed Care – PPO | Admitting: Urology

## 2020-12-12 VITALS — BP 118/76 | HR 98 | Ht 64.0 in | Wt 139.0 lb

## 2020-12-12 DIAGNOSIS — N200 Calculus of kidney: Secondary | ICD-10-CM | POA: Insufficient documentation

## 2020-12-12 NOTE — Progress Notes (Signed)
12/12/2020 2:27 PM   [name] [birth year] [phone]  Referring provider: Marinda Elk, MD [place] [facility] [place],  [place] [address]  Chief Complaint  Patient presents with   Nephrolithiasis    Urologic history:  1.  Recurrent nephrolithiasis Left ureteroscopic stone removal April 2015 CT 03/2019 bilateral, nonobstructing renal calculi L >R Metabolic evaluation 0/7371 low urine volume and hypocitraturia  HPI: 65 y.o. female presents for annual follow-up.  Doing well since last visit No bothersome LUTS Denies dysuria, gross hematuria Denies flank, abdominal or pelvic pain Was treated for a UTI last week with symptom resolution Urine culture grew mixed flora Did not get KUB previsit   PMH: Past Medical History:  Diagnosis Date   Cataracts, bilateral    Hx: of   Depression    Hx; of   Extrusion of scleral buckle 11/15/2019   Exposed stitch in the past superonasal quadrant, resected   Glaucoma    Hx; of   Hypertension    Kidney stones    Hx: of   Syncope    Hx; of one episode   Umbilical hernia    at birth    Surgical History: Past Surgical History:  Procedure Laterality Date   CATARACT EXTRACTION W/PHACO Right 04/13/2013   Procedure: RIGHT CATARACT EXTRACTION PHACO AND INTRAOCULAR LENS PLACEMENT ([place]);  Surgeon: Marylynn Pearson, MD;  Location: [facility];  Service: Ophthalmology;  Laterality: Right;   COLONOSCOPY     Hx; of   CRYOABLATION     of cervix   DILATION AND CURETTAGE OF UTERUS     EYE SURGERY     Hx: of laser surgery   KNEE ARTHROSCOPY     Hx: of right knee   SHOULDER ARTHROSCOPY W/ ROTATOR CUFF REPAIR     Hx: of right shoulder   TUBAL LIGATION      Home Medications:  Allergies as of 12/12/2020   No Known Allergies      Medication List        Accurate as of December 12, 2020  2:27 PM. If you have any questions, ask your nurse or doctor.          acetaminophen 500 MG tablet Commonly known  as: TYLENOL Take 500 mg by mouth every 6 (six) hours as needed for mild pain.   aspirin EC 81 MG tablet Take 81 mg by mouth daily.   brimonidine-timolol 0.2-0.5 % ophthalmic solution Commonly known as: COMBIGAN Place 1 drop into both eyes every 12 (twelve) hours.   cyanocobalamin 1000 MCG/ML injection Commonly known as: (VITAMIN B-12) Inject into the skin.   dorzolamide 2 % ophthalmic solution Commonly known as: TRUSOPT Apply to eye.   HAIR/SKIN/NAILS PO Take 3 tablets by mouth daily.   JUICE PLUS FIBRE PO Take 2 capsules by mouth daily.   losartan 25 MG tablet Commonly known as: COZAAR Take 25 mg by mouth daily.   losartan-hydrochlorothiazide 50-12.5 MG tablet Commonly known as: HYZAAR Take by mouth.   Lumigan 0.01 % Soln Generic drug: bimatoprost 1 drop at bedtime.   Rhopressa 0.02 % Soln Generic drug: Netarsudil Dimesylate Apply 1 drop to eye at bedtime.   Travoprost (BAK Free) 0.004 % Soln ophthalmic solution Commonly known as: TRAVATAN Place 1 drop into both eyes at bedtime.        Allergies: No Known Allergies  Family History: Family History  Problem Relation Age of Onset   Hypertension Mother    Diabetes Mother  Hypertension Father    Glaucoma Father    Cancer Other    Breast cancer Paternal Aunt    Breast cancer Paternal Aunt     Social History:  reports that she has never smoked. She has never used smokeless tobacco. She reports current alcohol use. She reports that she does not use drugs.   Physical Exam: BP 118/76   Pulse 98   Ht 5\' 4"  (1.626 m)   Wt 139 lb (63 kg)   BMI 23.86 kg/m   Constitutional:  Alert and oriented, No acute distress. HEENT: [place] AT, moist mucus membranes.  Trachea midline, no masses. Cardiovascular: No clubbing, cyanosis, or edema. Respiratory: Normal respiratory effort, no increased work of breathing.   Assessment & Plan:    1.  Bilateral nephrolithiasis Asymptomatic KUB today and if stable continue  annual follow-up Instructed to call earlier for flank pain/renal colic   Abbie Sons, MD  [place] [address] [place], [place] [address] [phone]

## 2020-12-24 ENCOUNTER — Telehealth: Payer: Self-pay | Admitting: *Deleted

## 2020-12-24 NOTE — Telephone Encounter (Signed)
-----   Message from Abbie Sons, MD sent at 12/23/2020  8:48 PM EDT ----- KUB shows stable renal calculi

## 2020-12-24 NOTE — Telephone Encounter (Signed)
Notified patient as instructed on her voice mail per [facility]

## 2021-01-17 ENCOUNTER — Other Ambulatory Visit: Payer: Self-pay | Admitting: Physician Assistant

## 2021-01-17 ENCOUNTER — Other Ambulatory Visit (HOSPITAL_COMMUNITY): Payer: Self-pay | Admitting: Physician Assistant

## 2021-01-17 DIAGNOSIS — E041 Nontoxic single thyroid nodule: Secondary | ICD-10-CM

## 2021-01-29 ENCOUNTER — Other Ambulatory Visit: Payer: Self-pay

## 2021-01-29 ENCOUNTER — Ambulatory Visit
Admission: RE | Admit: 2021-01-29 | Discharge: 2021-01-29 | Disposition: A | Discharge status: Home / Self Care | Payer: BC Managed Care – PPO | Source: Ambulatory Visit | Attending: Physician Assistant | Admitting: Physician Assistant

## 2021-01-29 DIAGNOSIS — E041 Nontoxic single thyroid nodule: Secondary | ICD-10-CM | POA: Diagnosis present

## 2021-02-08 IMAGING — MG MM DIGITAL SCREENING BILAT W/ TOMO AND CAD
8 series · 8 of 24 positions shown · non-contrast
Comparison: Previous exam(s).

CLINICAL DATA: Screening.

EXAM:
DIGITAL SCREENING BILATERAL MAMMOGRAM WITH TOMO AND CAD

[L CC synth-2D]
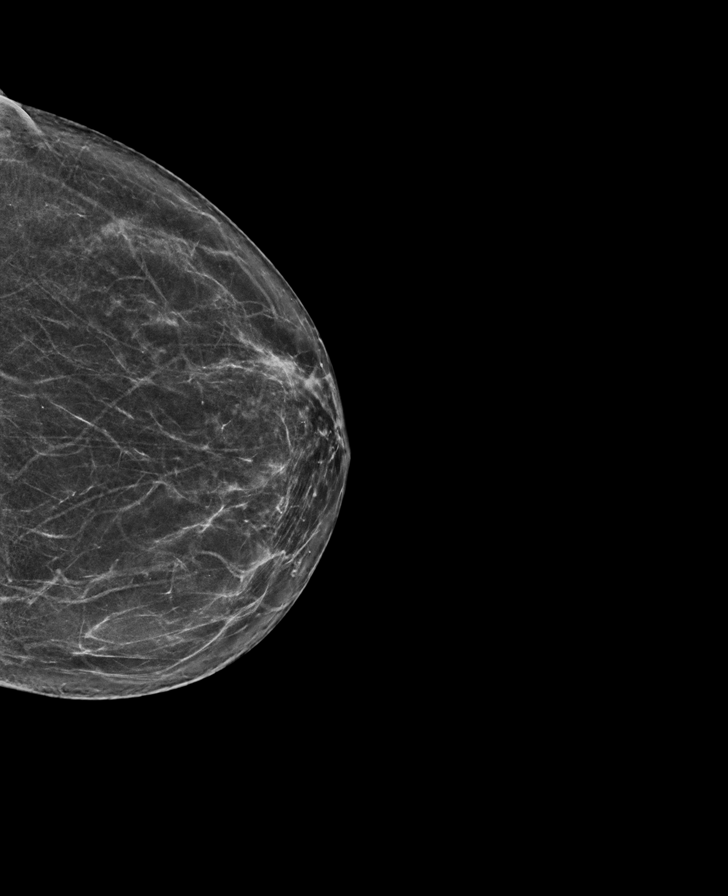

[R CC synth-2D]
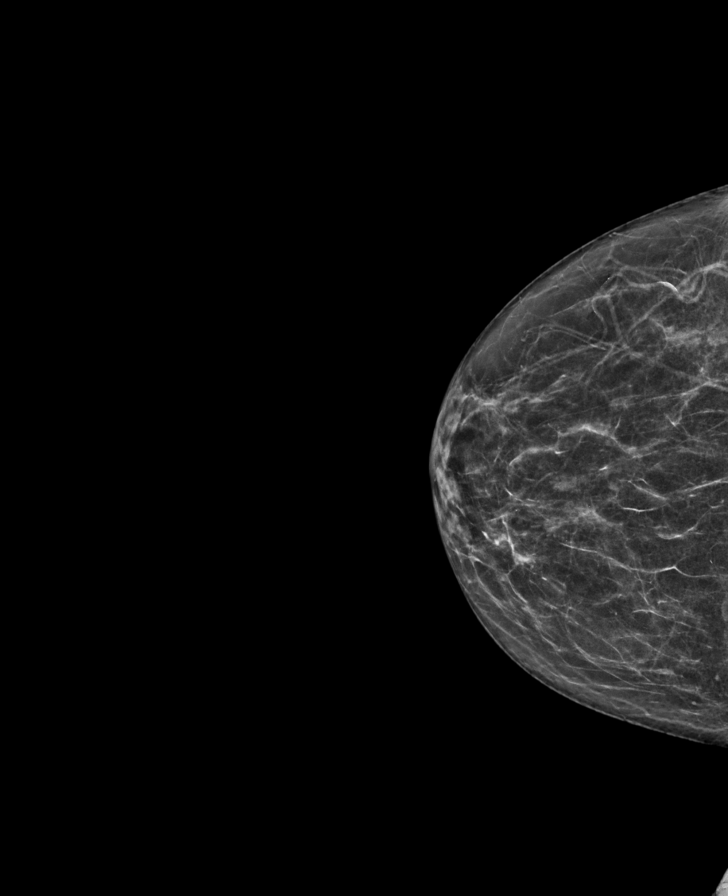

[L MLO synth-2D]
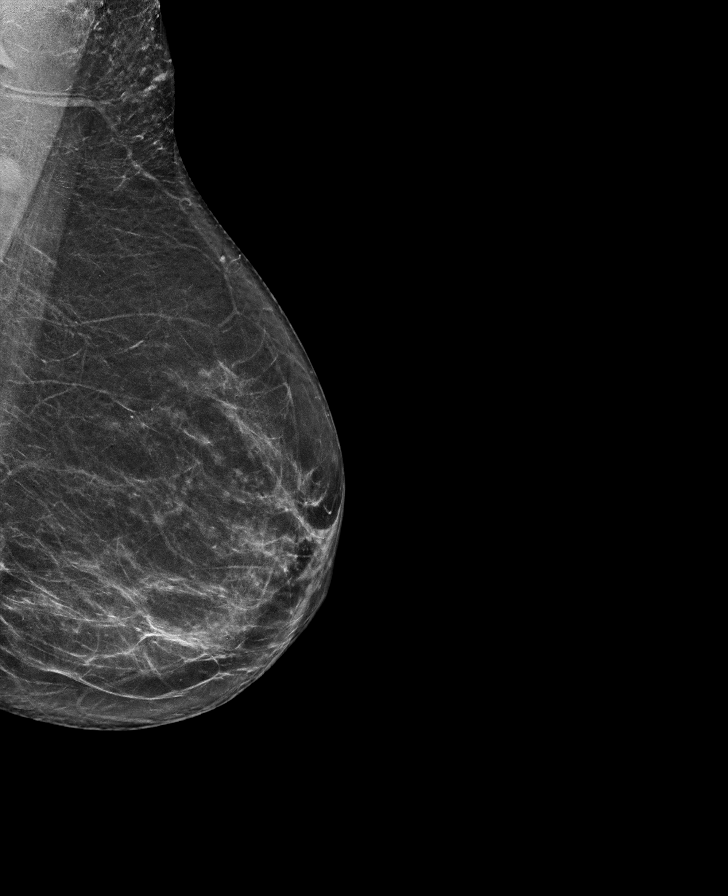

[R MLO synth-2D]
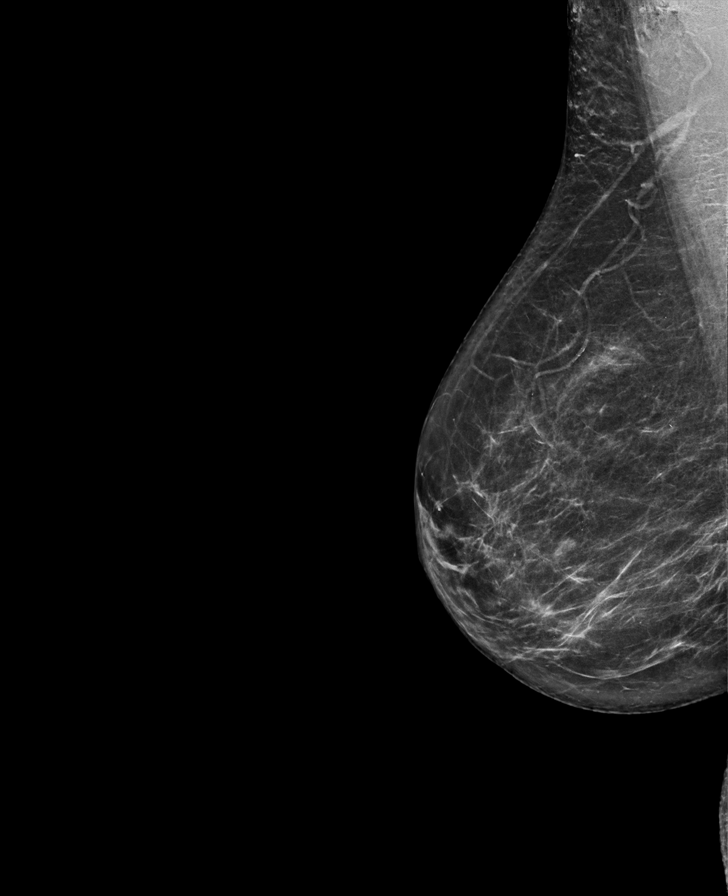

[R CC tomo · tomo slice 31/62.0]
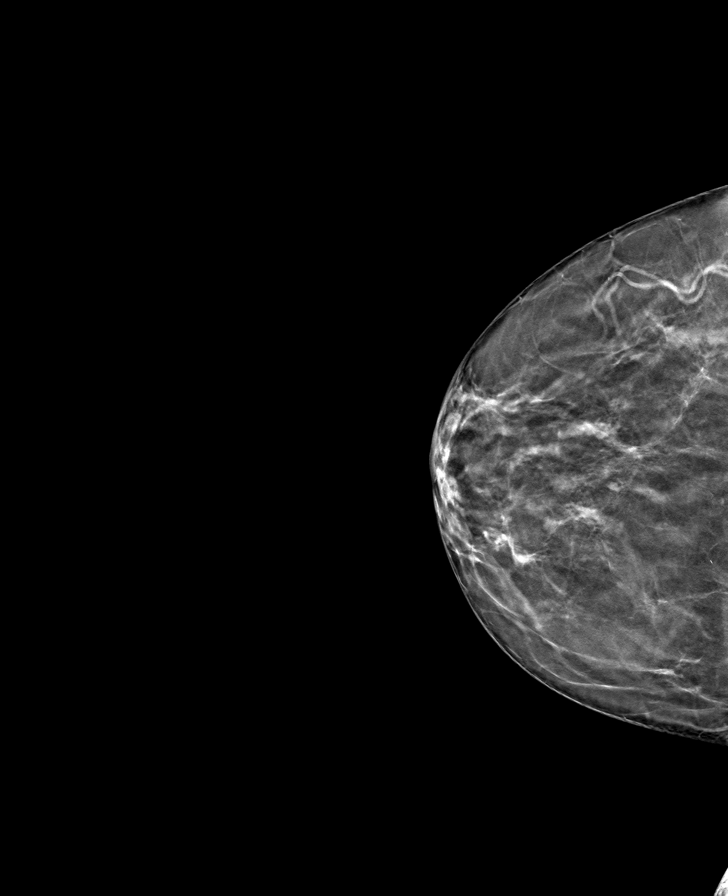

[L CC tomo · tomo slice 35/68.0]
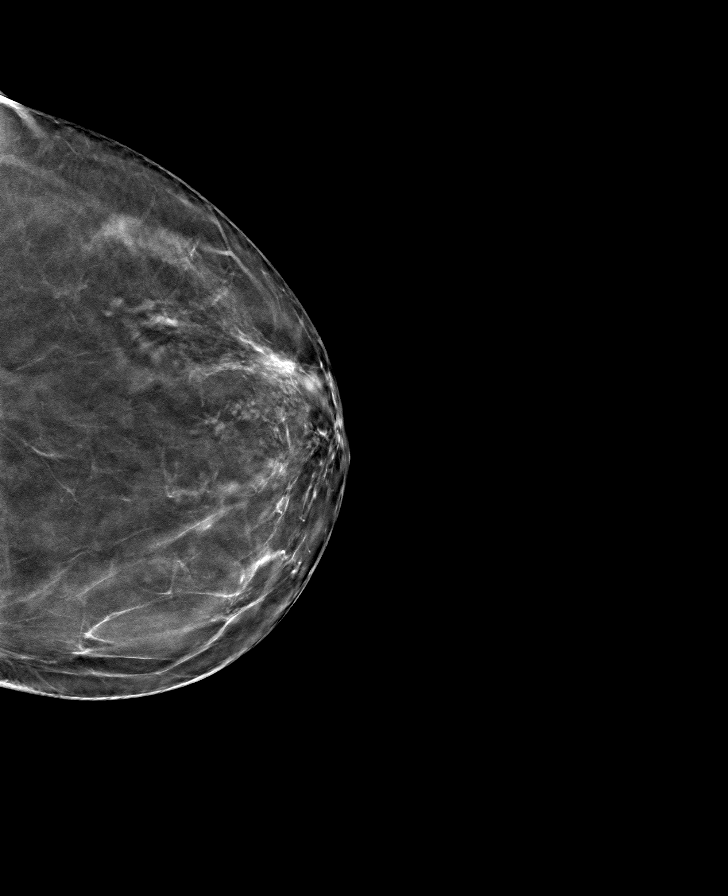

[R MLO tomo · tomo slice 35/70.0]
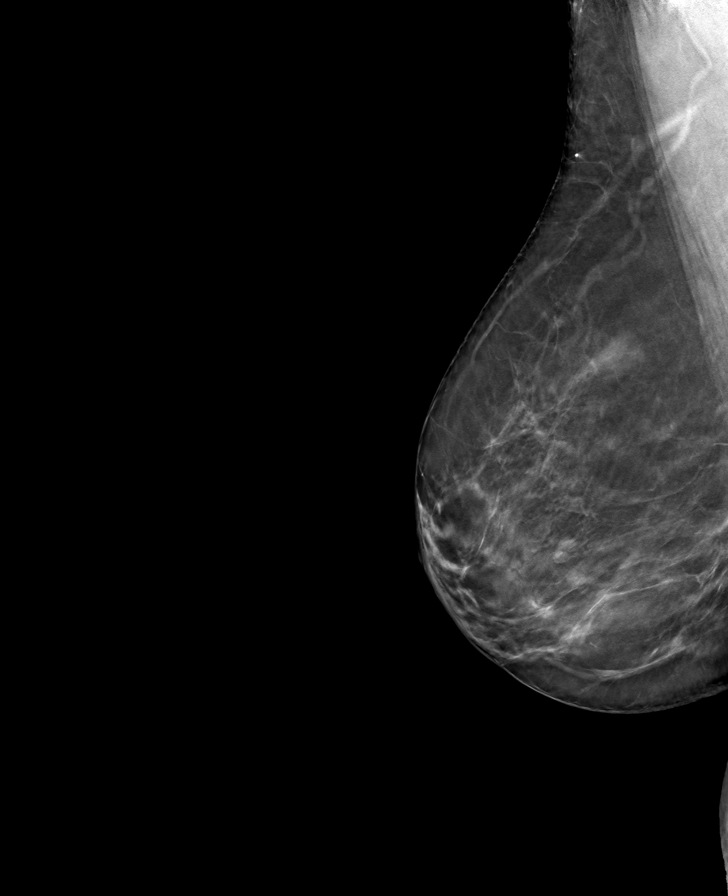

[L MLO tomo · tomo slice 36/71.0]
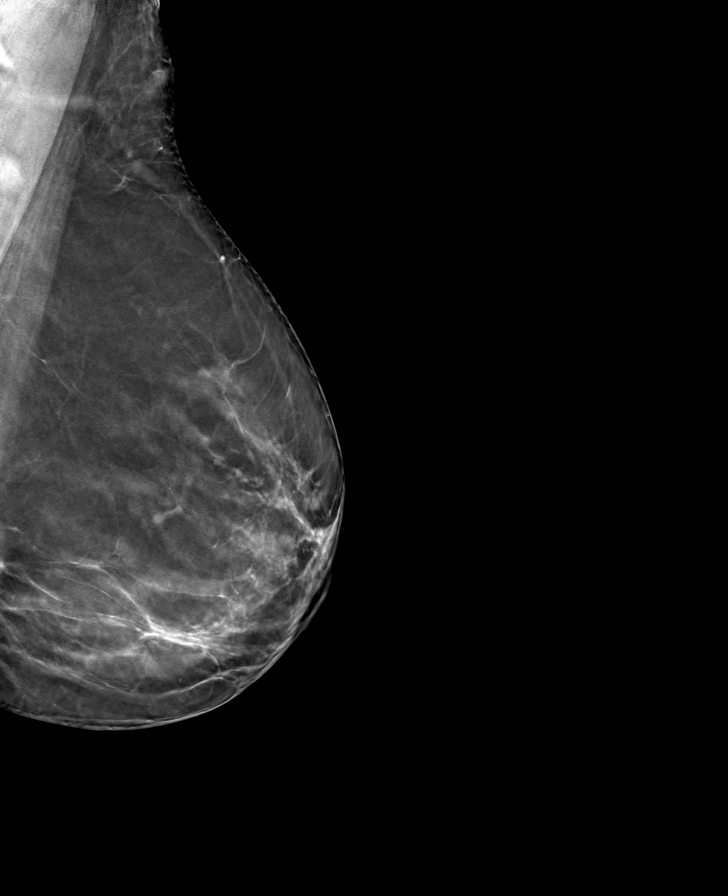

[8 of 24 positions shown; findings below may reference images not displayed]

ACR Breast Density Category b: There are scattered areas of
fibroglandular density.
FINDINGS: There are no findings suspicious for malignancy. The images were
evaluated with computer-aided detection.
IMPRESSION: No mammographic evidence of malignancy. A result letter of this
screening mammogram will be mailed directly to the patient.

RECOMMENDATION:
Screening mammogram in one year. (Code:ZP-[id number])

BI-RADS CATEGORY  1: Negative.

## 2021-02-12 ENCOUNTER — Other Ambulatory Visit: Payer: Self-pay | Admitting: Physician Assistant

## 2021-02-12 DIAGNOSIS — Z1231 Encounter for screening mammogram for malignant neoplasm of breast: Secondary | ICD-10-CM

## 2021-05-02 ENCOUNTER — Ambulatory Visit (INDEPENDENT_AMBULATORY_CARE_PROVIDER_SITE_OTHER): Payer: BC Managed Care – PPO | Admitting: Ophthalmology

## 2021-05-02 ENCOUNTER — Encounter (INDEPENDENT_AMBULATORY_CARE_PROVIDER_SITE_OTHER): Payer: Self-pay | Admitting: Ophthalmology

## 2021-05-02 ENCOUNTER — Encounter (INDEPENDENT_AMBULATORY_CARE_PROVIDER_SITE_OTHER): Payer: BC Managed Care – PPO | Admitting: Ophthalmology

## 2021-05-02 ENCOUNTER — Other Ambulatory Visit: Payer: Self-pay

## 2021-05-02 DIAGNOSIS — H401132 Primary open-angle glaucoma, bilateral, moderate stage: Secondary | ICD-10-CM | POA: Diagnosis not present

## 2021-05-02 DIAGNOSIS — H35341 Macular cyst, hole, or pseudohole, right eye: Secondary | ICD-10-CM | POA: Diagnosis not present

## 2021-05-02 DIAGNOSIS — Z8669 Personal history of other diseases of the nervous system and sense organs: Secondary | ICD-10-CM

## 2021-05-02 HISTORY — DX: Primary open-angle glaucoma, bilateral, moderate stage: H40.1132

## 2021-05-02 NOTE — Assessment & Plan Note (Signed)
Under the care of Dr. Marylynn Pearson follow-up as scheduled continue medications as recommended ?

## 2021-05-02 NOTE — Assessment & Plan Note (Signed)
Retinal detachment repair OD in the past, outlined in the overview noted above.  Stable over time ?

## 2021-05-02 NOTE — Assessment & Plan Note (Signed)
Irregular foveal OD but with no macular hole and no impact on acuity will continue to monitor and observe ?

## 2021-05-02 NOTE — Progress Notes (Signed)
05/02/2021     CHIEF COMPLAINT Patient presents for  Chief Complaint  Patient presents with   Macular Hole      HISTORY OF PRESENT ILLNESS: Zoe [name] is a 66 y.o. female who presents to the clinic today for:   HPI   1 yr fu ou FP. Pt states "blurred vision from time to time, fluctuates."in my peripheral vision I have noticed, within the last month, that when someone is handing me something like papers from the side I can't seem to see it. It is mostly only in my right eye. Here lately I have been sleeping on my side and my hand is against my eye and it seems like I can't see after I wake up when I have started to pay attention to that, within the past week."  Denies new FOL or floaters. Patient sees Dr. Venetia Maxon for Glaucoma. She is using Combigan BID OU, trusopt BID (per pt supposed to be three times a day) OU, rhopressa OU QHS , Lumigan OU QHS.  Last edited by Laurin Coder on 05/02/2021  3:29 PM.      Referring physician: Marylynn Pearson, MD [address],  [place] [address]  HISTORICAL INFORMATION:   Selected notes from the MEDICAL RECORD NUMBER       CURRENT MEDICATIONS: Current Outpatient Medications (Ophthalmic Drugs)  Medication Sig   brimonidine-timolol (COMBIGAN) 0.2-0.5 % ophthalmic solution Place 1 drop into both eyes every 12 (twelve) hours.   dorzolamide (TRUSOPT) 2 % ophthalmic solution Apply to eye.   LUMIGAN 0.01 % SOLN 1 drop at bedtime.   RHOPRESSA 0.02 % SOLN Apply 1 drop to eye at bedtime.   Travoprost, BAK Free, (TRAVATAN) 0.004 % SOLN ophthalmic solution Place 1 drop into both eyes at bedtime. (Patient not taking: No sig reported)   No current facility-administered medications for this visit. (Ophthalmic Drugs)   Current Outpatient Medications (Other)  Medication Sig   acetaminophen (TYLENOL) 500 MG tablet Take 500 mg by mouth every 6 (six) hours as needed for mild pain.   aspirin EC 81 MG tablet Take 81 mg by mouth daily.    cyanocobalamin (,VITAMIN B-12,) 1000 MCG/ML injection Inject into the skin.   losartan (COZAAR) 25 MG tablet Take 25 mg by mouth daily.   losartan-hydrochlorothiazide (HYZAAR) 50-12.5 MG tablet Take by mouth. (Patient not taking: No sig reported)   Multiple Vitamins-Minerals (HAIR/SKIN/NAILS PO) Take 3 tablets by mouth daily.   Nutritional Supplements (JUICE PLUS FIBRE PO) Take 2 capsules by mouth daily.   No current facility-administered medications for this visit. (Other)      REVIEW OF SYSTEMS:    ALLERGIES No Known Allergies  PAST MEDICAL HISTORY Past Medical History:  Diagnosis Date   Cataracts, bilateral    Hx: of   Depression    Hx; of   Extrusion of scleral buckle 11/15/2019   Exposed stitch in the past superonasal quadrant, resected   Glaucoma    Hx; of   Hypertension    Kidney stones    Hx: of   Syncope    Hx; of one episode   Umbilical hernia    at birth   Past Surgical History:  Procedure Laterality Date   CATARACT EXTRACTION W/PHACO Right 04/13/2013   Procedure: RIGHT CATARACT EXTRACTION PHACO AND INTRAOCULAR LENS PLACEMENT ([place]);  Surgeon: Marylynn Pearson, MD;  Location: [facility];  Service: Ophthalmology;  Laterality: Right;   COLONOSCOPY     Hx; of  CRYOABLATION     of cervix   DILATION AND CURETTAGE OF UTERUS     EYE SURGERY     Hx: of laser surgery   KNEE ARTHROSCOPY     Hx: of right knee   SHOULDER ARTHROSCOPY W/ ROTATOR CUFF REPAIR     Hx: of right shoulder   TUBAL LIGATION      FAMILY HISTORY Family History  Problem Relation Age of Onset   Hypertension Mother    Diabetes Mother    Hypertension Father    Glaucoma Father    Cancer Other    Breast cancer Paternal Aunt    Breast cancer Paternal Aunt     SOCIAL HISTORY Social History   Tobacco Use   Smoking status: Never   Smokeless tobacco: Never  Substance Use Topics   Alcohol use: Yes    Comment: occasional wine   Drug use: No         OPHTHALMIC EXAM:  Base Eye Exam      Visual Acuity (ETDRS)       Right Left   Dist cc 20/25 -2 20/25 +1    Correction: Glasses         Tonometry (Tonopen, 3:22 PM)       Right Left   Pressure 13 17         Pupils       Pupils Dark Light Shape APD   Right PERRL 4 3 Round None   Left PERRL 4 3 Round None         Visual Fields (Counting fingers)       Left Right     Full   Restrictions Partial outer superior temporal deficiency          Extraocular Movement       Right Left    Full Full         Neuro/Psych     Oriented x3: Yes   Mood/Affect: Normal         Dilation     Both eyes: 1.0% Mydriacyl, 2.5% Phenylephrine @ 3:22 PM           Slit Lamp and Fundus Exam     External Exam       Right Left   External Normal Normal         Slit Lamp Exam       Right Left   Lids/Lashes Normal Normal   Conjunctiva/Sclera White and quiet, no exposed sutures.  Buckle covered well with conjunctiva 360, elements visible inferotemporal and superotemporal White and quiet   Cornea Clear Clear   Anterior Chamber Deep and quiet Deep and quiet   Iris Round and reactive Round and reactive   Lens Centered posterior chamber intraocular lens Centered posterior chamber intraocular lens   Anterior Vitreous Normal Normal         Fundus Exam       Right Left   Posterior Vitreous Clear vitrectomized Central vitreous floaters, Posterior vitreous detachment   Disc Normal Normal   C/D Ratio 0.55 0.55   Macula Normal Normal   Vessels Normal Normal   Periphery Retina attached, chorioretinal scar nearly 360 good buckle effect Retinopexy superiorly, old lattice degeneration nearly 360, retinopexy inferiorly as well            IMAGING AND PROCEDURES  Imaging and Procedures for 05/02/21  Color Fundus Photography Optos - OU - Both Eyes       Right Eye Progression has been stable. Disc  findings include increased cup to disc ratio. Macula : normal observations.   Left Eye Progression has  been stable. Disc findings include normal observations. Macula : normal observations. Vessels : normal observations. Periphery : normal observations.   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  Stable over time  Primary open angle glaucoma of both eyes, moderate stage Under the care of Dr. Marylynn Pearson follow-up as scheduled continue medications as recommended     ICD-10-CM   1. Lamellar macular hole of right eye  H35.341 Color Fundus Photography Optos - OU - Both Eyes    2. History of retinal detachment  Z86.69     3. Primary open angle glaucoma of both eyes, moderate stage  H40.1132       1.  OD, stable with retina attached, no new element exposures of the scleral buckle, elements are visible inferotemporal and superotemporal, but well covered  2.  3.  Ophthalmic Meds Ordered this visit:  No orders of the defined types were placed in this encounter.      Return in about 1 year (around 05/03/2022) for DILATE OU, COLOR FP, OCT.  There are no Patient Instructions on file for this visit.   Explained the diagnoses, plan, and follow up with the patient and they expressed understanding.  Patient expressed understanding of the importance of proper follow up care.   Clent Demark Yzabelle Calles M.D. Diseases & Surgery of the Retina and Vitreous Retina & Diabetic [place] 05/02/21     Abbreviations: M myopia (nearsighted); A astigmatism; H hyperopia (farsighted); P presbyopia; Mrx spectacle prescription;  CTL contact lenses; OD right eye; OS left eye; OU both eyes  XT exotropia; ET esotropia; PEK punctate epithelial keratitis; PEE punctate  epithelial erosions; DES dry eye syndrome; MGD meibomian gland dysfunction; ATs artificial tears; PFAT's preservative free artificial tears; [place] nuclear sclerotic cataract; PSC posterior subcapsular cataract; ERM epi-retinal membrane; PVD posterior vitreous detachment; RD retinal detachment; DM diabetes mellitus; DR diabetic retinopathy; NPDR non-proliferative diabetic retinopathy; PDR proliferative diabetic retinopathy; CSME clinically significant macular edema; DME diabetic macular edema; dbh dot blot hemorrhages; CWS cotton wool spot; POAG primary open angle glaucoma; C/D cup-to-disc ratio; HVF humphrey visual field; GVF goldmann visual field; OCT optical coherence tomography; IOP intraocular pressure; BRVO Branch retinal vein occlusion; CRVO central retinal vein occlusion; CRAO central retinal artery occlusion; BRAO branch retinal artery occlusion; RT retinal tear; SB scleral buckle; PPV pars plana vitrectomy; VH Vitreous hemorrhage; PRP panretinal laser photocoagulation; IVK intravitreal kenalog; VMT vitreomacular traction; MH Macular hole;  NVD neovascularization of the disc; NVE neovascularization elsewhere; AREDS age related eye disease study; ARMD age related macular degeneration; POAG primary open angle glaucoma; EBMD epithelial/anterior basement membrane dystrophy; ACIOL anterior chamber intraocular lens; IOL intraocular lens; PCIOL posterior chamber intraocular lens; Phaco/IOL phacoemulsification with intraocular lens placement; [place] photorefractive keratectomy; LASIK laser assisted in situ keratomileusis; HTN hypertension; DM diabetes mellitus; COPD chronic obstructive pulmonary disease

## 2021-12-09 ENCOUNTER — Other Ambulatory Visit: Payer: Self-pay | Admitting: *Deleted

## 2021-12-09 DIAGNOSIS — N200 Calculus of kidney: Secondary | ICD-10-CM

## 2021-12-12 ENCOUNTER — Ambulatory Visit
Admission: RE | Admit: 2021-12-12 | Discharge: 2021-12-12 | Disposition: A | Discharge status: Home / Self Care | Payer: BC Managed Care – PPO | Attending: Urology | Admitting: Urology

## 2021-12-12 ENCOUNTER — Encounter: Payer: Self-pay | Admitting: Urology

## 2021-12-12 ENCOUNTER — Ambulatory Visit (INDEPENDENT_AMBULATORY_CARE_PROVIDER_SITE_OTHER): Payer: BC Managed Care – PPO | Admitting: Urology

## 2021-12-12 ENCOUNTER — Ambulatory Visit
Admission: RE | Admit: 2021-12-12 | Discharge: 2021-12-12 | Disposition: A | Discharge status: Home / Self Care | Payer: BC Managed Care – PPO | Source: Ambulatory Visit | Attending: Urology | Admitting: Urology

## 2021-12-12 VITALS — BP 113/76 | HR 80 | Ht 64.0 in | Wt 140.0 lb

## 2021-12-12 DIAGNOSIS — N200 Calculus of kidney: Secondary | ICD-10-CM

## 2021-12-12 DIAGNOSIS — R1030 Lower abdominal pain, unspecified: Secondary | ICD-10-CM | POA: Diagnosis not present

## 2021-12-12 DIAGNOSIS — N1330 Unspecified hydronephrosis: Secondary | ICD-10-CM

## 2021-12-12 NOTE — Progress Notes (Signed)
12/12/2021 1:50 PM   Zoe [name] Jul 07, 1955 [id number]  Referring provider: Marinda Elk, MD [place] [facility] [place],  [place] [address]  Chief Complaint  Patient presents with   Nephrolithiasis    Urologic history:  1.  Recurrent nephrolithiasis Left ureteroscopic stone removal April 2015 CT 03/2019 bilateral, nonobstructing renal calculi L >R Metabolic evaluation 11/238 low urine volume and hypocitraturia  HPI: 66 y.o. female presents for annual follow-up.  Seen [facility] September 2023 with bilateral lower abdominal pain and mild dysuria UA with significant RBCs at >182/29 WBC.  Urine culture grew mixed flora Follow-up UA showed persistent microhematuria/pyuria and urine culture negative Occasional mild right side pain   PMH: Past Medical History:  Diagnosis Date   Cataracts, bilateral    Hx: of   Depression    Hx; of   Extrusion of scleral buckle 11/15/2019   Exposed stitch in the past superonasal quadrant, resected   Glaucoma    Hx; of   Hypertension    Kidney stones    Hx: of   Syncope    Hx; of one episode   Umbilical hernia    at birth    Surgical History: Past Surgical History:  Procedure Laterality Date   CATARACT EXTRACTION W/PHACO Right 04/13/2013   Procedure: RIGHT CATARACT EXTRACTION PHACO AND INTRAOCULAR LENS PLACEMENT ([place]);  Surgeon: Marylynn Pearson, MD;  Location: [facility];  Service: Ophthalmology;  Laterality: Right;   COLONOSCOPY     Hx; of   CRYOABLATION     of cervix   DILATION AND CURETTAGE OF UTERUS     EYE SURGERY     Hx: of laser surgery   KNEE ARTHROSCOPY     Hx: of right knee   SHOULDER ARTHROSCOPY W/ ROTATOR CUFF REPAIR     Hx: of right shoulder   TUBAL LIGATION      Home Medications:  Allergies as of 12/12/2021   No Known Allergies      Medication List        Accurate as of December 12, 2021  1:50 PM. If you have any questions, ask your nurse or doctor.           acetaminophen 500 MG tablet Commonly known as: TYLENOL Take 500 mg by mouth every 6 (six) hours as needed for mild pain.   aspirin EC 81 MG tablet Take 81 mg by mouth daily.   brimonidine-timolol 0.2-0.5 % ophthalmic solution Commonly known as: COMBIGAN Place 1 drop into both eyes every 12 (twelve) hours.   cyanocobalamin 1000 MCG/ML injection Commonly known as: VITAMIN B12 Inject into the skin.   dorzolamide 2 % ophthalmic solution Commonly known as: TRUSOPT Apply to eye.   HAIR/SKIN/NAILS PO Take 3 tablets by mouth daily.   JUICE PLUS FIBRE PO Take 2 capsules by mouth daily.   losartan 25 MG tablet Commonly known as: COZAAR Take 25 mg by mouth daily.   losartan-hydrochlorothiazide 50-12.5 MG tablet Commonly known as: HYZAAR Take by mouth.   Lumigan 0.01 % Soln Generic drug: bimatoprost 1 drop at bedtime.   Rhopressa 0.02 % Soln Generic drug: Netarsudil Dimesylate Apply 1 drop to eye at bedtime.   Travoprost (BAK Free) 0.004 % Soln ophthalmic solution Commonly known as: TRAVATAN Place 1 drop into both eyes at bedtime.        Allergies: No Known Allergies  Family History: Family History  Problem Relation Age of Onset   Hypertension Mother    Diabetes Mother  Hypertension Father    Glaucoma Father    Cancer Other    Breast cancer Paternal Aunt    Breast cancer Paternal Aunt     Social History:  reports that she has never smoked. She has never used smokeless tobacco. She reports current alcohol use. She reports that she does not use drugs.   Physical Exam: BP 113/76   Pulse 80   Ht '5\' 4"'$  (1.626 m)   Wt 140 lb (63.5 kg)   BMI 24.03 kg/m   Constitutional:  Alert and oriented, No acute distress. HEENT: [place] AT, moist mucus membranes.  Trachea midline, no masses. Cardiovascular: No clubbing, cyanosis, or edema. Respiratory: Normal respiratory effort, no increased work of breathing.  Pertinent imaging: Images of a KUB performed today were  personally reviewed and interpreted.  There has been interval growth of her right renal calculus and it may have migrated more medial.  Due to overlying stool and bowel gas the renal outlines are not discernible.  The left renal calculus is stable   Assessment & Plan:    1.  Bilateral nephrolithiasis Recent abdominal pain and UA with microhematuria/pyuria with negative cultures Interval growth of the right renal calculus Recommend stone protocol CT We discussed potential treatment options including ureteroscopy and [place], MD  [facility] [address], [place], [place] [address] [phone]

## 2022-01-02 ENCOUNTER — Other Ambulatory Visit: Payer: Self-pay | Admitting: Physician Assistant

## 2022-01-02 DIAGNOSIS — Z1231 Encounter for screening mammogram for malignant neoplasm of breast: Secondary | ICD-10-CM

## 2022-01-03 ENCOUNTER — Ambulatory Visit: Payer: BC Managed Care – PPO

## 2022-01-03 ENCOUNTER — Ambulatory Visit
Admission: RE | Admit: 2022-01-03 | Discharge: 2022-01-03 | Disposition: A | Discharge status: Home / Self Care | Payer: BC Managed Care – PPO | Source: Ambulatory Visit | Attending: Urology | Admitting: Urology

## 2022-01-03 DIAGNOSIS — R1030 Lower abdominal pain, unspecified: Secondary | ICD-10-CM | POA: Diagnosis present

## 2022-01-03 DIAGNOSIS — N200 Calculus of kidney: Secondary | ICD-10-CM | POA: Diagnosis present

## 2022-01-07 ENCOUNTER — Telehealth: Payer: Self-pay | Admitting: *Deleted

## 2022-01-07 NOTE — Telephone Encounter (Signed)
-----   Message from Abbie Sons, MD sent at 01/07/2022  8:51 AM EST ----- CT shows a stone in the right renal pelvis which is most likely accounting for her symptoms.  Would recommend treatment.  Options ureteroscopy which she has had in the past and shockwave lithotripsy.  If she desires to discuss further would schedule follow-up office visit

## 2022-01-07 NOTE — Telephone Encounter (Signed)
Notified patient as instructed, patient will call us back to let us know which one she would like to do

## 2022-01-27 ENCOUNTER — Telehealth: Payer: Self-pay

## 2022-01-27 ENCOUNTER — Other Ambulatory Visit: Payer: Self-pay | Admitting: Urology

## 2022-01-27 DIAGNOSIS — N200 Calculus of kidney: Secondary | ICD-10-CM

## 2022-01-27 NOTE — Progress Notes (Signed)
Surgical Physician Order Form [facility] [place]  * Scheduling expectation : 02/11/2022  *Length of Case: 1 hour  *Clearance needed: no  *Anticoagulation Instructions: N/A  *Aspirin Instructions: Hold Aspirin  *Post-op visit Date/Instructions:  1 week cysto stent removal  *Diagnosis: Right Nephrolithiasis  *Procedure: Right Ureteroscopy w/laser lithotripsy & stent placement (31121)   Additional orders: N/A  -Admit type: OUTpatient  -Anesthesia: General  -VTE Prophylaxis Standing Order SCD's       Other:   -Standing Lab Orders Per Anesthesia    Lab other: UA&Urine Culture  -Standing Test orders EKG/Chest x-ray per Anesthesia       Test other:   - Medications:  Ancef 2gm IV  -Other orders:  N/A

## 2022-01-27 NOTE — Progress Notes (Signed)
   [facility] From  Surgery Date: Date: 02/11/2022  Surgeon: Dr. John Giovanni, MD  Inpt ( No  )   Outpt (Yes)   Obs ( No  )   Diagnosis: N20.0 Right Nephrolithiasis  -CPT: [phone]  Surgery: Right Ureteroscopy with laser lithotripsy and stent placement  Stop Anticoagulations: No,  hold ASA  Cardiac/Medical/Pulmonary Clearance needed: no  *Orders entered into EPIC  Date: 01/27/22   *Case booked in [place]  Date: 01/22/2022  *Notified pt of Surgery: Date: 01/22/2022  PRE-OP UA & CX: yes, will obtain in clinic on 01/30/2022  *Placed into Prior Authorization Work Fabio Bering Date: 01/27/22  Assistant/laser/rep:No

## 2022-01-27 NOTE — Telephone Encounter (Signed)
I spoke with Zoe [name]. We have discussed possible surgery dates and Tuesday December 12th, 2023 was agreed upon by all parties. Patient given information about surgery date, what to expect pre-operatively and post operatively.  We discussed that a Pre-Admission Testing office will be calling to set up the pre-op visit that will take place prior to surgery, and that these appointments are typically done over the phone with a Pre-Admissions RN.  Informed patient that our office will communicate any additional care to be provided after surgery. Patients questions or concerns were discussed during our call. Advised to call our office should there be any additional information, questions or concerns that arise. Patient verbalized understanding.

## 2022-01-28 ENCOUNTER — Other Ambulatory Visit: Payer: BC Managed Care – PPO

## 2022-01-28 DIAGNOSIS — N200 Calculus of kidney: Secondary | ICD-10-CM

## 2022-01-29 LAB — URINALYSIS, COMPLETE
Bilirubin, UA: NEGATIVE
Glucose, UA: NEGATIVE
Ketones, UA: NEGATIVE
Nitrite, UA: NEGATIVE
Specific Gravity, UA: 1.03 (ref 1.005–1.030)
Urobilinogen, Ur: 0.2 mg/dL (ref 0.2–1.0)
pH, UA: 5.5 (ref 5.0–7.5)

## 2022-01-29 LAB — MICROSCOPIC EXAMINATION
RBC, Urine: >30 /hpf — AB (ref 0–2)
WBC, UA: >30 /hpf — AB (ref 0–5)

## 2022-01-30 ENCOUNTER — Other Ambulatory Visit: Payer: BC Managed Care – PPO

## 2022-01-31 LAB — CULTURE, URINE COMPREHENSIVE

## 2022-02-03 ENCOUNTER — Telehealth: Payer: Self-pay

## 2022-02-03 MED ORDER — SULFAMETHOXAZOLE-TRIMETHOPRIM 800-160 MG PO TABS: 1.0000 | ORAL_TABLET | Freq: Two times a day (BID) | ORAL | 0 refills | Status: DC

## 2022-02-03 NOTE — Telephone Encounter (Signed)
-----   Message from Abbie Sons, MD sent at 02/03/2022  2:21 PM EST ----- Preop urine culture is positive.  Please send in Rx Septra DS 1 twice daily x 7 days to start Wednesday, 02/05/2022.  Also change IV antibiotic to gentamicin per pharmacy.

## 2022-02-03 NOTE — Telephone Encounter (Signed)
Spoke with pt. Pt. Advised to start medication on Wednesday 12/6. Verbalized understanding.

## 2022-02-03 NOTE — Telephone Encounter (Signed)
Entered in Error

## 2022-02-04 ENCOUNTER — Encounter
Admission: RE | Admit: 2022-02-04 | Discharge: 2022-02-04 | Disposition: A | Discharge status: Home / Self Care | Payer: BC Managed Care – PPO | Source: Ambulatory Visit | Attending: Urology | Admitting: Urology

## 2022-02-04 HISTORY — DX: Diverticulosis of intestine, part unspecified, without perforation or abscess without bleeding: K57.90

## 2022-02-04 HISTORY — DX: Macular cyst, hole, or pseudohole, right eye: H35.341

## 2022-02-04 HISTORY — DX: Personal history of urinary calculi: Z87.442

## 2022-02-04 HISTORY — DX: Serous retinal detachment, right eye: H33.21

## 2022-02-04 HISTORY — DX: Palpitations: R00.2

## 2022-02-04 NOTE — Addendum Note (Signed)
Addended by: Gerald Leitz A on: 02/04/2022 02:22 PM   Modules accepted: Orders

## 2022-02-04 NOTE — Patient Instructions (Addendum)
Your procedure is scheduled on: Tuesday, December 12 Report to the Registration Desk on the 1st floor of the [facility]. To find out your arrival time, please call [phone] between 1PM - 3PM on: Monday, December 11 If your arrival time is 6:00 am, do not arrive prior to that time as the [facility] entrance doors do not open until 6:00 am.  REMEMBER: Instructions that are not followed completely may result in serious medical risk, up to and including death; or upon the discretion of your surgeon and anesthesiologist your surgery may need to be rescheduled.  Do not eat OR drink after midnight the night before surgery.  No gum chewing, lozengers or hard candies.  TAKE THESE MEDICATIONS THE MORNING OF SURGERY WITH A SIP OF WATER:  Amlodipine Brimonidine-timolol eye drops Dorzolamide (Trusopt) eye drops  One week prior to surgery: starting December 5 Stop Anti-inflammatories (NSAIDS) such as Advil, Aleve, Ibuprofen, Motrin, Naproxen, Naprosyn and Aspirin based products such as Excedrin, Goodys Powder, BC Powder. Stop ANY OVER THE COUNTER supplements until after surgery. You may however, continue to take Tylenol if needed for pain up until the day of surgery.  No Alcohol for 24 hours before or after surgery.  No Smoking including e-cigarettes for 24 hours prior to surgery.  No chewable tobacco products for at least 6 hours prior to surgery.  No nicotine patches on the day of surgery.  Do not use any "recreational" drugs for at least a week prior to your surgery.  Please be advised that the combination of cocaine and anesthesia may have negative outcomes, up to and including death. If you test positive for cocaine, your surgery will be cancelled.  On the morning of surgery brush your teeth with toothpaste and water, you may rinse your mouth with mouthwash if you wish. Do not swallow any toothpaste or mouthwash.  Do not wear jewelry, make-up, hairpins, clips or nail polish.  Do  not wear lotions, powders, or perfumes.   Do not shave body from the neck down 48 hours prior to surgery just in case you cut yourself which could leave a site for infection.   Contact lenses, hearing aids and dentures may not be worn into surgery.  Do not bring valuables to the hospital. [facility] is not responsible for any missing/lost belongings or valuables.   Notify your doctor if there is any change in your medical condition (cold, fever, infection).  Wear comfortable clothing (specific to your surgery type) to the hospital.  After surgery, you can help prevent lung complications by doing breathing exercises.  Take deep breaths and cough every 1-2 hours. Your doctor may order a device called an Incentive Spirometer to help you take deep breaths.  If you are being discharged the day of surgery, you will not be allowed to drive home. You will need a responsible adult (18 years or older) to drive you home and stay with you that night.   If you are taking public transportation, you will need to have a responsible adult (18 years or older) with you. Please confirm with your physician that it is acceptable to use public transportation.   Please call the [facility] Dept. at [phone] if you have any questions about these instructions.  Surgery Visitation Policy:  Patients undergoing a surgery or procedure may have two family members or support persons with them as long as the person is not COVID-19 positive or experiencing its symptoms.

## 2022-02-11 ENCOUNTER — Encounter: Payer: Self-pay | Admitting: Urology

## 2022-02-11 ENCOUNTER — Ambulatory Visit
Admission: RE | Admit: 2022-02-11 | Discharge: 2022-02-11 | Disposition: A | Discharge status: Home / Self Care | Payer: BC Managed Care – PPO | Source: Ambulatory Visit | Attending: Urology | Admitting: Urology

## 2022-02-11 ENCOUNTER — Ambulatory Visit: Payer: BC Managed Care – PPO | Admitting: Urgent Care

## 2022-02-11 ENCOUNTER — Encounter
Admission: RE | Disposition: A | Discharge status: Home / Self Care | Payer: Self-pay | Source: Ambulatory Visit | Attending: Urology

## 2022-02-11 ENCOUNTER — Other Ambulatory Visit: Payer: Self-pay

## 2022-02-11 ENCOUNTER — Ambulatory Visit: Payer: BC Managed Care – PPO

## 2022-02-11 DIAGNOSIS — N132 Hydronephrosis with renal and ureteral calculous obstruction: Secondary | ICD-10-CM | POA: Insufficient documentation

## 2022-02-11 DIAGNOSIS — I10 Essential (primary) hypertension: Secondary | ICD-10-CM | POA: Diagnosis not present

## 2022-02-11 DIAGNOSIS — N200 Calculus of kidney: Secondary | ICD-10-CM | POA: Diagnosis not present

## 2022-02-11 HISTORY — PX: CYSTOSCOPY/URETEROSCOPY/HOLMIUM LASER/STENT PLACEMENT: SHX6546

## 2022-02-11 HISTORY — PX: CYSTOSCOPY W/ RETROGRADES: SHX1426

## 2022-02-11 SURGERY — CYSTOSCOPY/URETEROSCOPY/HOLMIUM LASER/STENT PLACEMENT
Anesthesia: General | Laterality: Right

## 2022-02-11 MED ORDER — ROCURONIUM BROMIDE 100 MG/10ML IV SOLN
INTRAVENOUS | Status: DC | PRN
  Administered 2022-02-11: 50 mg via INTRAVENOUS

## 2022-02-11 MED ORDER — DEXAMETHASONE SODIUM PHOSPHATE 10 MG/ML IJ SOLN
INTRAMUSCULAR | Status: AC
  Filled 2022-02-11: qty 1

## 2022-02-11 MED ORDER — PROPOFOL 10 MG/ML IV BOLUS
INTRAVENOUS | Status: AC
  Filled 2022-02-11: qty 20

## 2022-02-11 MED ORDER — PHENYLEPHRINE HCL-NACL 20-0.9 MG/250ML-% IV SOLN
INTRAVENOUS | Status: DC | PRN
  Administered 2022-02-11: 32 ug/min via INTRAVENOUS

## 2022-02-11 MED ORDER — FAMOTIDINE 20 MG PO TABS: 20.0000 mg | ORAL_TABLET | Freq: Once | ORAL | Status: AC

## 2022-02-11 MED ORDER — ORAL CARE MOUTH RINSE: 15.0000 mL | Freq: Once | OROMUCOSAL | Status: AC

## 2022-02-11 MED ORDER — PROPOFOL 10 MG/ML IV BOLUS
INTRAVENOUS | Status: DC | PRN
  Administered 2022-02-11: 150 mg via INTRAVENOUS

## 2022-02-11 MED ORDER — FENTANYL CITRATE (PF) 100 MCG/2ML IJ SOLN
INTRAMUSCULAR | Status: DC | PRN
  Administered 2022-02-11: 100 ug via INTRAVENOUS

## 2022-02-11 MED ORDER — ONDANSETRON HCL 4 MG/2ML IJ SOLN
INTRAMUSCULAR | Status: DC | PRN
  Administered 2022-02-11: 4 mg via INTRAVENOUS

## 2022-02-11 MED ORDER — LIDOCAINE HCL (PF) 2 % IJ SOLN
INTRAMUSCULAR | Status: AC
  Filled 2022-02-11: qty 5

## 2022-02-11 MED ORDER — GENTAMICIN SULFATE 40 MG/ML IJ SOLN
5.0000 mg/kg | Freq: Once | INTRAVENOUS | Status: AC
  Administered 2022-02-11: 320 mg via INTRAVENOUS
  Filled 2022-02-11: qty 8

## 2022-02-11 MED ORDER — SODIUM CHLORIDE 0.9 % IR SOLN
Status: DC | PRN
  Administered 2022-02-11: 3000 mL

## 2022-02-11 MED ORDER — FENTANYL CITRATE (PF) 100 MCG/2ML IJ SOLN
INTRAMUSCULAR | Status: AC
  Filled 2022-02-11: qty 2

## 2022-02-11 MED ORDER — CHLORHEXIDINE GLUCONATE 0.12 % MT SOLN
OROMUCOSAL | Status: AC
  Administered 2022-02-11: 15 mL via OROMUCOSAL
  Filled 2022-02-11: qty 15

## 2022-02-11 MED ORDER — FAMOTIDINE 20 MG PO TABS
ORAL_TABLET | ORAL | Status: AC
  Administered 2022-02-11: 20 mg via ORAL
  Filled 2022-02-11: qty 1

## 2022-02-11 MED ORDER — ACETAMINOPHEN 10 MG/ML IV SOLN
INTRAVENOUS | Status: DC | PRN
  Administered 2022-02-11: 1000 mg via INTRAVENOUS

## 2022-02-11 MED ORDER — IOHEXOL 180 MG/ML  SOLN
INTRAMUSCULAR | Status: DC | PRN
  Administered 2022-02-11: 20 mL

## 2022-02-11 MED ORDER — ACETAMINOPHEN 10 MG/ML IV SOLN
INTRAVENOUS | Status: AC
  Filled 2022-02-11: qty 100

## 2022-02-11 MED ORDER — FENTANYL CITRATE (PF) 100 MCG/2ML IJ SOLN: 25.0000 ug | INTRAMUSCULAR | Status: DC | PRN

## 2022-02-11 MED ORDER — ONDANSETRON HCL 4 MG/2ML IJ SOLN
INTRAMUSCULAR | Status: AC
  Filled 2022-02-11: qty 2

## 2022-02-11 MED ORDER — PHENYLEPHRINE 80 MCG/ML (10ML) SYRINGE FOR IV PUSH (FOR BLOOD PRESSURE SUPPORT)
PREFILLED_SYRINGE | INTRAVENOUS | Status: AC
  Filled 2022-02-11: qty 10

## 2022-02-11 MED ORDER — SUGAMMADEX SODIUM 200 MG/2ML IV SOLN
INTRAVENOUS | Status: DC | PRN
  Administered 2022-02-11: 200 mg via INTRAVENOUS

## 2022-02-11 MED ORDER — CHLORHEXIDINE GLUCONATE 0.12 % MT SOLN: 15.0000 mL | Freq: Once | OROMUCOSAL | Status: AC

## 2022-02-11 MED ORDER — DEXAMETHASONE SODIUM PHOSPHATE 10 MG/ML IJ SOLN
INTRAMUSCULAR | Status: DC | PRN
  Administered 2022-02-11: 10 mg via INTRAVENOUS

## 2022-02-11 MED ORDER — OXYBUTYNIN CHLORIDE 5 MG PO TABS: ORAL_TABLET | ORAL | 0 refills | Status: DC

## 2022-02-11 MED ORDER — TAMSULOSIN HCL 0.4 MG PO CAPS: 0.4000 mg | ORAL_CAPSULE | Freq: Every day | ORAL | 0 refills | Status: DC

## 2022-02-11 MED ORDER — LACTATED RINGERS IV SOLN: INTRAVENOUS | Status: DC

## 2022-02-11 MED ORDER — HYDROCODONE-ACETAMINOPHEN 5-325 MG PO TABS: 1.0000 | ORAL_TABLET | Freq: Four times a day (QID) | ORAL | 0 refills | Status: DC | PRN

## 2022-02-11 MED ORDER — PHENYLEPHRINE HCL (PRESSORS) 10 MG/ML IV SOLN
INTRAVENOUS | Status: DC | PRN
  Administered 2022-02-11: 80 ug via INTRAVENOUS
  Administered 2022-02-11 (×2): 160 ug via INTRAVENOUS

## 2022-02-11 MED ORDER — ROCURONIUM BROMIDE 10 MG/ML (PF) SYRINGE
PREFILLED_SYRINGE | INTRAVENOUS | Status: AC
  Filled 2022-02-11: qty 10

## 2022-02-11 MED ORDER — ONDANSETRON HCL 4 MG/2ML IJ SOLN: 4.0000 mg | Freq: Once | INTRAMUSCULAR | Status: DC | PRN

## 2022-02-11 MED ORDER — LIDOCAINE HCL (CARDIAC) PF 100 MG/5ML IV SOSY
PREFILLED_SYRINGE | INTRAVENOUS | Status: DC | PRN
  Administered 2022-02-11: 100 mg via INTRAVENOUS

## 2022-02-11 SURGICAL SUPPLY — 30 items
BAG DRAIN SIEMENS DORNER NS (MISCELLANEOUS) ×2 IMPLANT
BAG DRN NS LF (MISCELLANEOUS) ×2
BASKET ZERO TIP 1.9FR (BASKET) IMPLANT
BRUSH SCRUB EZ 1% IODOPHOR (MISCELLANEOUS) ×2 IMPLANT
BSKT STON RTRVL ZERO TP 1.9FR (BASKET)
CATH URET FLEX-TIP 2 LUMEN 10F (CATHETERS) IMPLANT
CATH URETL OPEN END 6X70 (CATHETERS) IMPLANT
CNTNR SPEC 2.5X3XGRAD LEK (MISCELLANEOUS)
CONT SPEC 4OZ STRL OR WHT (MISCELLANEOUS)
CONTAINER SPEC 2.5X3XGRAD LEK (MISCELLANEOUS) IMPLANT
DRAPE UTILITY 15X26 TOWEL STRL (DRAPES) ×2 IMPLANT
FIBER LASER MOSES 200 DFL (Laser) IMPLANT
GLOVE SURG UNDER POLY LF SZ7.5 (GLOVE) ×2 IMPLANT
GOWN STRL REUS W/ TWL LRG LVL3 (GOWN DISPOSABLE) ×2 IMPLANT
GOWN STRL REUS W/ TWL XL LVL3 (GOWN DISPOSABLE) ×2 IMPLANT
GOWN STRL REUS W/TWL LRG LVL3 (GOWN DISPOSABLE) ×2
GOWN STRL REUS W/TWL XL LVL3 (GOWN DISPOSABLE) ×2
GUIDEWIRE GREEN .038 145CM (MISCELLANEOUS) IMPLANT
GUIDEWIRE STR DUAL SENSOR (WIRE) ×2 IMPLANT
IV NS IRRIG 3000ML ARTHROMATIC (IV SOLUTION) ×2 IMPLANT
KIT TURNOVER CYSTO (KITS) ×2 IMPLANT
PACK CYSTO AR (MISCELLANEOUS) ×2 IMPLANT
SET CYSTO W/LG BORE CLAMP LF (SET/KITS/TRAYS/PACK) ×2 IMPLANT
SHEATH NAVIGATOR HD 12/14X36 (SHEATH) IMPLANT
STENT URET 6FRX24 CONTOUR (STENTS) IMPLANT
STENT URET 6FRX26 CONTOUR (STENTS) IMPLANT
SURGILUBE 2OZ TUBE FLIPTOP (MISCELLANEOUS) ×2 IMPLANT
TRAP FLUID SMOKE EVACUATOR (MISCELLANEOUS) ×2 IMPLANT
VALVE UROSEAL ADJ ENDO (VALVE) IMPLANT
WATER STERILE IRR 500ML POUR (IV SOLUTION) ×2 IMPLANT

## 2022-02-11 NOTE — H&P (Signed)
Urology H&P  Urologic history:   1.  Recurrent nephrolithiasis Left ureteroscopic stone removal April 2015 CT 03/2019 bilateral, nonobstructing renal calculi L >R Metabolic evaluation 08/960 low urine volume and hypocitraturia  History of Present Illness: Zoe [name] is a 65 y.o. recently seen for mild right abdominal pain.  CT abdomen pelvis 01/03/2022 remarkable for a 12 mm right renal pelvic calculus with mild right hydronephrosis.  After discussing treatment options she has elected ureteroscopic removal.  Preop urine culture grew a low level of staph hemolyticus consistent with colonization and she was started on Septra DS preoperatively  Past Medical History:  Diagnosis Date   Cataracts, bilateral    Hx: of   Depression    Hx; of   Diverticulosis    Extrusion of scleral buckle 11/15/2019   Exposed stitch in the past superonasal quadrant, resected   Glaucoma    both eyes   Heart palpitations    History of kidney stones    Hypertension    Lamellar macular hole of right eye    Retinal detachment, right    Syncope    Hx; of one episode   Umbilical hernia    at birth    Past Surgical History:  Procedure Laterality Date   CATARACT EXTRACTION W/ INTRAOCULAR LENS IMPLANT Left    CATARACT EXTRACTION W/PHACO Right 04/13/2013   Procedure: RIGHT CATARACT EXTRACTION PHACO AND INTRAOCULAR LENS PLACEMENT ([place]);  Surgeon: Marylynn Pearson, MD;  Location: [facility];  Service: Ophthalmology;  Laterality: Right;   COLONOSCOPY  01/06/2020   COLONOSCOPY     2010, 2016   CRYOABLATION     of cervix   DILATION AND CURETTAGE OF UTERUS     ESOPHAGOGASTRODUODENOSCOPY  2010   KNEE ARTHROSCOPY     Hx: of right knee   RETINAL DETACHMENT SURGERY Right 2015   x2   SHOULDER ARTHROSCOPY W/ ROTATOR CUFF REPAIR Left 2010   TUBAL LIGATION  2012    Home Medications:  Current Meds  Medication Sig   acetaminophen (TYLENOL) 500 MG tablet Take 500 mg by mouth every 6 (six) hours as needed for mild  pain.   amLODipine (NORVASC) 5 MG tablet Take 5 mg by mouth daily.   brimonidine-timolol (COMBIGAN) 0.2-0.5 % ophthalmic solution Place 1 drop into both eyes every 12 (twelve) hours.   dorzolamide (TRUSOPT) 2 % ophthalmic solution Place 1 drop into both eyes daily.   losartan (COZAAR) 100 MG tablet Take 100 mg by mouth daily.   LUMIGAN 0.01 % SOLN Place 1 drop into both eyes at bedtime.   RHOPRESSA 0.02 % SOLN Place 1 drop into both eyes at bedtime.   sulfamethoxazole-trimethoprim (BACTRIM DS) 800-160 MG tablet Take 1 tablet by mouth every 12 (twelve) hours.    Allergies: No Known Allergies  Family History  Problem Relation Age of Onset   Hypertension Mother    Diabetes Mother    Hypertension Father    Glaucoma Father    Cancer Other    Breast cancer Paternal Aunt    Breast cancer Paternal Aunt     Social History:  reports that she has never smoked. She has never used smokeless tobacco. She reports current alcohol use. She reports that she does not use drugs.  ROS: A complete review of systems was performed.  All systems are negative except for pertinent findings as noted.  Physical Exam:  Vital signs in last 24 hours: Temp:  [97.7 F (36.5 C)] 97.7 F (36.5 C) (12/12 0622)  Pulse Rate:  [67] 67 (12/12 0622) Resp:  [16] 16 (12/12 0622) BP: (148)/(80) 148/80 (12/12 0622) SpO2:  [99 %] 99 % (12/12 0622) Weight:  [65.8 kg] 65.8 kg (12/12 0622) Constitutional:  Alert and oriented, No acute distress HEENT: [place] AT, Trachea midline, no masses Cardiovascular: Regular rate and rhythm Respiratory: Lungs clear bilaterally  Laboratory Data:  No results for input(s): "WBC", "HGB", "HCT" in the last 72 hours. No results for input(s): "NA", "K", "CL", "CO2", "GLUCOSE", "BUN", "CREATININE", "CALCIUM" in the last 72 hours. No results for input(s): "LABPT", "INR" in the last 72 hours. No results for input(s): "LABURIN" in the last 72 hours. Results for orders placed or performed in visit  on 01/28/22  CULTURE, URINE COMPREHENSIVE     Status: Abnormal   Collection Time: 01/28/22  2:44 PM   Specimen: Urine   Urine  Result Value Ref Range Status   Urine Culture, Comprehensive Final report (A)  Final   Organism ID, Bacteria Comment (A)  Final    Comment: Staphylococcus haemolyticus Based on resistance to oxacillin this isolate would be resistant to all currently available beta-lactam antimicrobial agents, with the exception of the newer cephalosporins with anti-MRSA activity, such as Ceftaroline 25,000-50,000 colony forming units per mL    Organism ID, Bacteria Not applicable  Final   ANTIMICROBIAL SUSCEPTIBILITY Comment  Final    Comment:       ** S = Susceptible; I = Intermediate; R = Resistant **                    P = Positive; N = Negative             MICS are expressed in micrograms per mL    Antibiotic                 RSLT#1    RSLT#2    RSLT#3    RSLT#4 Ciprofloxacin                  R Gentamicin                     S Levofloxacin                   R Linezolid                      S Moxifloxacin                   R Nitrofurantoin                 S Oxacillin                      R Penicillin                     R Rifampin                       S Tetracycline                   S Trimethoprim/Sulfa             S Vancomycin                     S   Microscopic Examination     Status: Abnormal   Collection Time: 01/28/22  2:44 PM   Urine  Result  Value Ref Range Status   WBC, UA >30 (A) 0 - 5 /hpf Final   RBC, Urine >30 (A) 0 - 2 /hpf Final   Epithelial Cells (non renal) 0-10 0 - 10 /hpf Final   Mucus, UA Present (A) Not Estab. Final   Bacteria, UA Moderate (A) None seen/Few Final     Impression/Assessment:  12 mm right renal pelvic calculus with mild hydronephrosis  Plan:  Ureteroscopic stone removal.  The procedure has been discussed in detail including potential risks of bleeding, infection/sepsis and ureteral injury.  The need for a stent  postoperatively and the possibility of stent related symptoms were reviewed.  All questions were answered and she desires to proceed   02/11/2022, 7:24 AM  John Giovanni,  MD

## 2022-02-11 NOTE — Discharge Instructions (Addendum)
DISCHARGE INSTRUCTIONS FOR KIDNEY STONE/URETERAL STENT   MEDICATIONS:  1. Resume all your other meds from home.  2.  AZO (over-the-counter) can help with the burning/stinging when you urinate. 3.  Hydrocodone is for moderate/severe pain, Rx was sent to your pharmacy. 4.  Oxybutynin and tamsulosin will help with bladder/stent irritation.  Rxs were sent to your pharmacy  ACTIVITY:  1. May resume regular activities in 24 hours. 2. No driving while on narcotic pain medications  3. Drink plenty of water  4. Continue to walk at home - you can still get blood clots when you are at home, so keep active, but don't over do it.  5. May return to work/school tomorrow or when you feel ready   BATHING:  1. You can shower. 2. You have a string coming from your urethra: The stent string is attached to your ureteral stent. Do not pull on this.   SIGNS/SYMPTOMS TO CALL:  Common postoperative symptoms include urinary frequency, urgency, bladder spasm and blood in the urine  Please call us if you have a fever greater than 101.5, uncontrolled nausea/vomiting, uncontrolled pain, dizziness, unable to urinate, excessively bloody urine, chest pain, shortness of breath, leg swelling, leg pain, or any other concerns or questions.   You can reach [place] at [phone].   FOLLOW-UP:  1. You have a follow-up appointment scheduled 02/19/2022 for stent removal  AMBULATORY SURGERY  DISCHARGE INSTRUCTIONS   The drugs that you were given will stay in your system until tomorrow so for the next 24 hours you should not:  Drive an automobile Make any legal decisions Drink any alcoholic beverage   You may resume regular meals tomorrow.  Today it is better to start with liquids and gradually work up to solid foods.  You may eat anything you prefer, but it is better to start with liquids, then soup and crackers, and gradually work up to solid foods.   Please notify your doctor immediately if you have any unusual  bleeding, trouble breathing, redness and pain at the surgery site, drainage, fever, or pain not relieved by medication.    Additional Instructions:        Please contact your physician with any problems or Same Day Surgery at [phone], Monday through Friday 6 am to 4 pm, or [place] at [facility] number at [phone].

## 2022-02-11 NOTE — Interval H&P Note (Signed)
History and Physical Interval Note:  02/11/2022 7:30 AM  Zoe [name]  has presented today for surgery, with the diagnosis of Right Nephrolithiasis.  The various methods of treatment have been discussed with the patient and family. After consideration of risks, benefits and other options for treatment, the patient has consented to  Procedure(s): CYSTOSCOPY/URETEROSCOPY/HOLMIUM LASER/STENT PLACEMENT (Right) as a surgical intervention.  The patient's history has been reviewed, patient examined, no change in status, stable for surgery.  I have reviewed the patient's chart and labs.  Questions were answered to the patient's satisfaction.     [place]

## 2022-02-11 NOTE — Anesthesia Preprocedure Evaluation (Signed)
Anesthesia Evaluation  Patient identified by MRN, date of birth, ID band Patient awake    Reviewed: Allergy & Precautions, H&P , NPO status , Patient's Chart, lab work & pertinent test results, reviewed documented beta blocker date and time   Airway Mallampati: II  TM Distance: >3 FB Neck ROM: full    Dental  (+) Teeth Intact   Pulmonary neg pulmonary ROS   Pulmonary exam normal        Cardiovascular Exercise Tolerance: Good hypertension, On Medications negative cardio ROS Normal cardiovascular exam Rate:Normal     Neuro/Psych  PSYCHIATRIC DISORDERS  Depression    negative neurological ROS     GI/Hepatic negative GI ROS, Neg liver ROS,,,  Endo/Other  negative endocrine ROS    Renal/GU Renal disease  negative genitourinary   Musculoskeletal   Abdominal   Peds  Hematology negative hematology ROS (+)   Anesthesia Other Findings   Reproductive/Obstetrics negative OB ROS                             Anesthesia Physical Anesthesia Plan  ASA: 2  Anesthesia Plan: General LMA   Post-op Pain Management:    Induction:   PONV Risk Score and Plan: 3  Airway Management Planned:   Additional Equipment:   Intra-op Plan:   Post-operative Plan:   Informed Consent: I have reviewed the patients History and Physical, chart, labs and discussed the procedure including the risks, benefits and alternatives for the proposed anesthesia with the patient or authorized representative who has indicated his/her understanding and acceptance.       Plan Discussed with: CRNA  Anesthesia Plan Comments:        Anesthesia Quick Evaluation

## 2022-02-11 NOTE — Transfer of Care (Signed)
Immediate Anesthesia Transfer of Care Note  Patient: Zoe [name]  Procedure(s) Performed: CYSTOSCOPY/URETEROSCOPY/HOLMIUM LASER/STENT PLACEMENT (Right) CYSTOSCOPY WITH RETROGRADE PYELOGRAM  Patient Location: PACU  Anesthesia Type:General  Level of Consciousness: awake, alert , and oriented  Airway & Oxygen Therapy: Patient Spontanous Breathing and Patient connected to face mask oxygen  Post-op Assessment: Report given to RN and Post -op Vital signs reviewed and stable  Post vital signs: stable  Last Vitals:  Vitals Value Taken Time  BP 153/83 02/11/22 0854  Temp    Pulse 69 02/11/22 0857  Resp 14 02/11/22 0857  SpO2 100 % 02/11/22 0857  Vitals shown include unvalidated device data.  Last Pain:  Vitals:   02/11/22 0622  TempSrc: Temporal  PainSc: 5          Complications: No notable events documented.

## 2022-02-11 NOTE — Op Note (Signed)
Preoperative diagnosis: Right nephrolithiasis   Postoperative diagnosis: Right nephrolithiasis  Procedure:  Cystoscopy Right ureteroscopy and stone removal Ureteroscopic laser lithotripsy Right ureteral stent placement (51F/24 cm)  Right retrograde pyelography with interpretation Intraoperative fluoroscopy < 30 minutes  Surgeon: Nicki Reaper C. Samanthajo Payano, M.D.  Anesthesia: General  Complications: None  Intraoperative findings: Cystoscopy-bladder mucosa normal in appearance without erythema, solid or papillary lesions.  UOs normal-appearing bilaterally Right ureteropyeloscopy-no ureteral lesions or calculi.  Calculus identified in the right renal pelvis. Right retrograde pyelography demonstrated a filling defect within the right renal pelvis consistent with the patient's known calculus without other abnormalities.  Dilated upper pole calyx. Retrograde pyelography post procedure showed no filling defects, stone fragments or contrast extravasation  EBL: Minimal  Specimens: None   Indication: Zoe [name] is a 66 y.o. female with history of stone disease and mild right flank pain.  Renal stone CT showed a 12 mm right renal pelvic calculus with mild hydronephrosis.  After reviewing the management options for treatment, the patient elected to proceed with the above surgical procedure(s). We have discussed the potential benefits and risks of the procedure, side effects of the proposed treatment, the likelihood of the patient achieving the goals of the procedure, and any potential problems that might occur during the procedure or recuperation. Informed consent has been obtained.  Description of procedure:  The patient was taken to the operating room and general anesthesia was induced.  The patient was placed in the dorsal lithotomy position, prepped and draped in the usual sterile fashion, and preoperative antibiotics were administered. A preoperative time-out was performed.   A 21 French  cystoscope sheath with obturator was lubricated and passed per urethra.  The obturator was removed and replaced with a 30 degree lens.  Panendoscopy was performed with findings as described above  Attention was directed to the right ureteral orifice and a 0.038 Sensor wire was then advanced up the ureter into the renal pelvis under fluoroscopic guidance.  The cystoscope was removed and a dual-lumen catheter was placed over the sensor wire however resistance was met in the midportion of the distal ureter.  The sensor wire was removed and a 4.5 French semirigid ureteroscope was lubricated and passed per urethra.  The scope was inserted alongside the guidewire and easily advanced to the lower proximal ureter.  Retrograde pyelogram was performed through the ureteroscope with findings as described above.  An Amplatz Super Stiff wire was then placed the ureteroscope and advanced into an upper pole calyx without difficulty.  The semirigid ureteroscope was removed and an 8 [place] single channel digital flexible ureteroscope was placed over the Super Stiff wire.  Resistance was met in the midportion of the distal ureter however the scope was able to be negotiated past this area.  Once in the lower proximal ureter the Super Stiff wire was removed and easily advanced into the renal pelvis.  Pyeloscopy was performed with findings as described above.  A 200 m Moses holmium laser fiber was placed through the ureteroscope and the stone was dusted at a setting of 0.3J/80 Hz.  A few fragments tipped off the stone settled and a posterior midpole calyx and these were further fragmented with noncontact laser lithotripsy at 0.6J/40 Hz.  Retrograde pyelogram was then performed through the ureteroscope and all calyces were examined.  No fragments larger than the tip of the laser fiber were identified..   All stone fragments were then removed from the collecting system with a zero tip nitinol basket.  Retrograde  pyelogram was  performed and each calyx was sequentially examined under fluoroscopic guidance and no significant size fragments were identified.  A 59F/24 cm Contour ureteral stent was placed under fluoroscopic guidance.  The wire was then removed with an adequate stent curl noted in the renal pelvis as well as in the bladder.  The bladder was then emptied and the procedure ended.  The patient appeared to tolerate the procedure well and without complications.  After anesthetic reversal the patient was transported to the PACU in stable condition.   Plan: Scheduled for cystoscopy with stent removal and office on 02/19/2022  John Giovanni, MD

## 2022-02-11 NOTE — OR Nursing (Signed)
Dr. Bernardo Heater notified that patient wasn't able to start her antibiotic until Saturday evening. He reports it is okay to proceed.

## 2022-02-11 NOTE — Anesthesia Procedure Notes (Signed)
Procedure Name: Intubation Date/Time: 02/11/2022 7:42 AM  Performed by: Natasha Mead, CRNAPre-anesthesia Checklist: Patient identified, Emergency Drugs available, Suction available and Patient being monitored Patient Re-evaluated:Patient Re-evaluated prior to induction Oxygen Delivery Method: Circle system utilized Preoxygenation: Pre-oxygenation with 100% oxygen Induction Type: IV induction Ventilation: Mask ventilation without difficulty Laryngoscope Size: McGraph and 3 Grade View: Grade I Tube type: Oral Tube size: 6.5 mm Number of attempts: 1 Airway Equipment and Method: Stylet and Oral airway Placement Confirmation: ETT inserted through vocal cords under direct vision, positive ETCO2 and breath sounds checked- equal and bilateral Secured at: 19 cm Tube secured with: Tape Dental Injury: Teeth and Oropharynx as per pre-operative assessment

## 2022-02-12 ENCOUNTER — Telehealth: Payer: Self-pay

## 2022-02-12 ENCOUNTER — Encounter: Payer: Self-pay | Admitting: Urology

## 2022-02-12 MED ORDER — HYDROCODONE-ACETAMINOPHEN 5-325 MG PO TABS: 1.0000 | ORAL_TABLET | Freq: Four times a day (QID) | ORAL | 0 refills | Status: DC | PRN

## 2022-02-12 NOTE — Addendum Note (Signed)
Addended by: John Giovanni C on: 02/12/2022 04:00 PM   Modules accepted: Orders

## 2022-02-12 NOTE — Anesthesia Postprocedure Evaluation (Signed)
Anesthesia Post Note  Patient: Zoe [name]  Procedure(s) Performed: CYSTOSCOPY/URETEROSCOPY/HOLMIUM LASER/STENT PLACEMENT (Right) CYSTOSCOPY WITH RETROGRADE PYELOGRAM  Patient location during evaluation: PACU Anesthesia Type: General Level of consciousness: awake and alert Pain management: pain level controlled Vital Signs Assessment: post-procedure vital signs reviewed and stable Respiratory status: spontaneous breathing, nonlabored ventilation, respiratory function stable and patient connected to nasal cannula oxygen Cardiovascular status: blood pressure returned to baseline and stable Postop Assessment: no apparent nausea or vomiting Anesthetic complications: no   No notable events documented.   Last Vitals:  Vitals:   02/11/22 0915 02/11/22 1008  BP: (!) 145/77 (!) 144/70  Pulse: 61 66  Resp: 18 16  Temp:  36.5 C  SpO2: 96% 100%    Last Pain:  Vitals:   02/12/22 0908  TempSrc:   PainSc: [place] Nitza Schmid

## 2022-02-12 NOTE — Telephone Encounter (Signed)
Left message on voice mail about pain medication called in

## 2022-02-12 NOTE — Telephone Encounter (Signed)
Incoming call on triage line from pt regarding pain medication. Pt states she had surgery yesterday, she was given 5 pain pills to take as needed and has already used 3. She is requesting a few more if possible. She would like them sent to the [facility] on [address].

## 2022-02-13 ENCOUNTER — Telehealth: Payer: Self-pay

## 2022-02-13 NOTE — Telephone Encounter (Signed)
Pt calls triage line and states that has had an increase in blood in her urine today. She states that her urine is dark with a red tent, similar to wine. She denies clots. She denies fever, chills, or n/v. She states that she has had three 3oz drinks today of water, tea, and orange juice. Advised patient to increase hydration significantly at least 3 - 16oz bottles of water. Patient states that her pain is a 5/5 however she has not taken her pain meds today. Patient is taking Oxybutynin as prescribed.  Advised pt to take Norco as prescribed. Pt voiced understanding.

## 2022-02-14 ENCOUNTER — Telehealth: Payer: Self-pay | Admitting: Family Medicine

## 2022-02-14 NOTE — Telephone Encounter (Signed)
Patient called and states she has been having ride side pain and noticed blood in her urine and also has not had a bowel movement in a couple days. I informed her that the stent is going to cause a little discomfort and blood in the urine is normal as long as there are no clots and she is able to empty bladder fine. I informed to to take a [place] and drink plenty of water and also take a stool softener for a couple days. Patient voiced understanding.

## 2022-02-14 NOTE — Telephone Encounter (Signed)
Agree on recommendations

## 2022-02-17 ENCOUNTER — Ambulatory Visit (INDEPENDENT_AMBULATORY_CARE_PROVIDER_SITE_OTHER): Payer: BC Managed Care – PPO | Admitting: Urology

## 2022-02-17 ENCOUNTER — Ambulatory Visit
Admission: RE | Admit: 2022-02-17 | Discharge: 2022-02-17 | Disposition: A | Discharge status: Home / Self Care | Payer: BC Managed Care – PPO | Attending: Urology | Admitting: Urology

## 2022-02-17 ENCOUNTER — Ambulatory Visit
Admission: RE | Admit: 2022-02-17 | Discharge: 2022-02-17 | Disposition: A | Discharge status: Home / Self Care | Payer: BC Managed Care – PPO | Source: Ambulatory Visit | Attending: Urology | Admitting: Urology

## 2022-02-17 ENCOUNTER — Encounter: Payer: Self-pay | Admitting: Urology

## 2022-02-17 ENCOUNTER — Other Ambulatory Visit: Payer: Self-pay | Admitting: Family Medicine

## 2022-02-17 ENCOUNTER — Other Ambulatory Visit
Admission: RE | Admit: 2022-02-17 | Discharge: 2022-02-17 | Disposition: A | Discharge status: Home / Self Care | Payer: BC Managed Care – PPO | Source: Home / Self Care | Attending: Urology | Admitting: Urology

## 2022-02-17 VITALS — BP 98/58 | HR 64 | Temp 98.1°F | Ht 64.0 in | Wt 149.0 lb

## 2022-02-17 DIAGNOSIS — R8281 Pyuria: Secondary | ICD-10-CM | POA: Diagnosis not present

## 2022-02-17 DIAGNOSIS — R3989 Other symptoms and signs involving the genitourinary system: Secondary | ICD-10-CM

## 2022-02-17 DIAGNOSIS — R5383 Other fatigue: Secondary | ICD-10-CM | POA: Diagnosis present

## 2022-02-17 DIAGNOSIS — N200 Calculus of kidney: Secondary | ICD-10-CM

## 2022-02-17 DIAGNOSIS — R310 Gross hematuria: Secondary | ICD-10-CM | POA: Insufficient documentation

## 2022-02-17 DIAGNOSIS — R031 Nonspecific low blood-pressure reading: Secondary | ICD-10-CM

## 2022-02-17 DIAGNOSIS — R8271 Bacteriuria: Secondary | ICD-10-CM | POA: Diagnosis not present

## 2022-02-17 LAB — URINALYSIS, COMPLETE (UACMP) WITH MICROSCOPIC
Bilirubin Urine: NEGATIVE
Glucose, UA: NEGATIVE mg/dL
Ketones, ur: NEGATIVE mg/dL
Nitrite: NEGATIVE
Protein, ur: >300 mg/dL — AB
Specific Gravity, Urine: 1.02 (ref 1.005–1.030)
pH: 5.5 (ref 5.0–8.0)

## 2022-02-17 LAB — HEMOGLOBIN AND HEMATOCRIT, BLOOD
HCT: 34.1 % — ABNORMAL LOW (ref 36.0–46.0)
Hemoglobin: 11.1 g/dL — ABNORMAL LOW (ref 12.0–15.0)

## 2022-02-17 MED ORDER — CEFTRIAXONE SODIUM 500 MG IJ SOLR
1000.0000 mg | Freq: Once | INTRAMUSCULAR | Status: AC
  Administered 2022-02-17: 1000 mg via INTRAMUSCULAR

## 2022-02-17 MED ORDER — HYDROCODONE-ACETAMINOPHEN 5-325 MG PO TABS: 1.0000 | ORAL_TABLET | Freq: Four times a day (QID) | ORAL | 0 refills | Status: DC | PRN

## 2022-02-17 MED ORDER — SULFAMETHOXAZOLE-TRIMETHOPRIM 800-160 MG PO TABS: 1.0000 | ORAL_TABLET | Freq: Two times a day (BID) | ORAL | 0 refills | Status: DC

## 2022-02-17 MED ORDER — IOHEXOL 300 MG/ML  SOLN
100.0000 mL | Freq: Once | INTRAMUSCULAR | Status: AC | PRN
  Administered 2022-02-17: 100 mL via INTRAVENOUS

## 2022-02-17 NOTE — Progress Notes (Unsigned)
02/17/2022 4:51 PM   Zoe [name] 02-11-1956 [id number]  Referring provider: Marinda Elk, MD [facility] Zoe [name],  Zoe [name] [address]  Urological history: 1.  Nephrolithiasis -24-hour urine study 8 revealed low urine volume -CT renal stone study (01/2022) - New mild right hydronephrosis and perinephric stranding, with 12 mm calculus in right renal pelvis.  Non obstructing [name] nephrolithiasis. -s/p right URS (01/2022)   2. High risk hematuria -hx of gross heme -non-smoker -CT renal stone study (01/2022) -no malignancies -cysto (01/2022) - NED -reports of gross heme - currently has stent -UA 21-50 RBC's - likely due to stent and/or infection    Chief Complaint  Patient presents with   Nephrolithiasis    HPI: Zoe [name] is a 66 y.o. female who presents today for gross heme and pain.    She is status post right ureteroscopy on February 11, 2022.   She contacted the office the next day requesting that more of her pain medication be sent.  She then contacted the office on the 14th stating that there was an increase of blood in her urine, but she denied any clots, fever, chills or nausea and vomiting.  She had not taken in a lot of fluid that day, so she advised to increase hydration.  She then called back on the 15th stating that she has been having right-sided pain, gross hematuria and constipation.  She was informed to increase water intake and to take Senokot.  She had labs at her PCP on 02/14/2022 for what I assume is for an upcoming yearly physical. Her urinalysis was light orange cloudy, specific gravity 1.012, pH 6.5, 1+ protein, 3+ blood, 3+ leukocyte, 11 WBCs, greater than 182 RBCs, 0-5 bacteria and 0 squamous epithelial cells.  Urine culture with no growth.  Her hemoglobin was 11.2 and her hematocrit was 35.0.  Serum creatinine was 1.3.    She states that she has been having dark red blood, the color of [place], since the  stent was placed on Tuesday, but yesterday she started having bright red blood with clots.  She states it is continued until this morning and that the specimen she [name] today was the first clear urine that she has had.  She is not taking daily aspirin's.  She does take 2 blood pressure medications, amlodipine and losartan.  She took them this morning.  She states she was on her feet all day yesterday at church and when she [name] the building she had intense right-sided flank pain.  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  She states she had all she could do to drive home and get in the house and take a pain medication.  The pain pill ease off the pain and she was able to rest.   She has not been able to take in a lot of fluid due to some nausea, but denies any vomiting.   Patient denies any modifying or aggravating factors.  Patient denies any dysuria or suprapubic.  Patient denies any fevers, chills or vomiting.  She is also concerned about her continuing pain and is requesting more pain medication as she only has two pain pills [name].    UA yellow hazy, SG 1.020, pH 5.5, large Hgb, > 300 protein, small leukocytes, 0-5 squamous epithelial cells, 11-20 WBC's, 21-50 RBC's and many bacteria.    KUB shows  stent in position.     STAT hemoglobin/hematocrit 11.1/34.1.  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    Surgical History: Past Surgical History:  Procedure Laterality Date   CATARACT  EXTRACTION W/ INTRAOCULAR LENS IMPLANT [name]    CATARACT EXTRACTION W/PHACO Right 04/13/2013   Procedure: RIGHT CATARACT EXTRACTION PHACO AND INTRAOCULAR LENS PLACEMENT ([place]);  Surgeon: Marylynn Pearson, MD;  Location: [facility];  Service: Ophthalmology;  Laterality: Right;   COLONOSCOPY  01/06/2020   COLONOSCOPY     2010, 2016   CRYOABLATION     of cervix   CYSTOSCOPY W/ RETROGRADES  02/11/2022   Procedure: CYSTOSCOPY WITH RETROGRADE PYELOGRAM;  Surgeon: Abbie Sons, MD;  Location: [facility];  Service: Urology;;   CYSTOSCOPY/URETEROSCOPY/HOLMIUM LASER/STENT PLACEMENT Right 02/11/2022   Procedure: CYSTOSCOPY/URETEROSCOPY/HOLMIUM LASER/STENT PLACEMENT;  Surgeon: Abbie Sons, MD;  Location: [facility];  Service: Urology;  Laterality: Right;   DILATION AND CURETTAGE OF UTERUS     ESOPHAGOGASTRODUODENOSCOPY  2010   KNEE ARTHROSCOPY     Hx: of right knee   RETINAL DETACHMENT SURGERY Right 2015   x2   SHOULDER ARTHROSCOPY W/ ROTATOR CUFF REPAIR [name] 2010   TUBAL LIGATION  2012    Home Medications:  Allergies as of 02/17/2022   No Known Allergies      Medication List        Accurate as of February 17, 2022  4:51 PM. If you have any questions, ask your nurse or doctor.          STOP taking these medications    HAIR/SKIN/NAILS PO Stopped by: Zara Council, PA-C       TAKE these medications    acetaminophen 500 MG tablet Commonly known as: TYLENOL Take 500 mg by mouth every 6 (six) hours as needed for mild pain.   amLODipine 5 MG tablet Commonly known as: NORVASC Take 5 mg by mouth daily.   brimonidine-timolol 0.2-0.5 % ophthalmic solution Commonly known as: COMBIGAN Place 1 drop into both eyes every 12 (twelve) hours.   Cyanocobalamin 1000 MCG/15ML Liqd Place 1,000 mcg under the tongue daily.   dorzolamide 2 % ophthalmic solution Commonly known as: TRUSOPT Place 1 drop into both eyes daily.   ergocalciferol 1.25 MG (50000 UT) capsule Commonly known as:  VITAMIN D2 Take 50,000 Units by mouth once a week. Sunday   HAIR SKIN & NAILS GUMMIES PO Take 2 each by mouth daily.   HYDROcodone-acetaminophen 5-325 MG tablet Commonly known as: NORCO/VICODIN Take 1 tablet by mouth every 6 (six) hours as needed for moderate pain.   latanoprost 0.005 % ophthalmic solution Commonly known as: XALATAN 1 drop at bedtime.   losartan 100 MG tablet Commonly known as: COZAAR Take 100 mg by mouth daily.   Lumigan 0.01 % Soln Generic drug: bimatoprost Place 1 drop into both eyes at bedtime.   oxybutynin 5 MG tablet Commonly known as: DITROPAN 1  tab tid prn frequency,urgency, bladder spasm   Rhopressa 0.02 % Soln Generic drug: Netarsudil Dimesylate Place 1 drop into both eyes at bedtime.   sulfamethoxazole-trimethoprim 800-160 MG tablet Commonly known as: BACTRIM DS Take 1 tablet by mouth every 12 (twelve) hours.   tamsulosin 0.4 MG Caps capsule Commonly known as: FLOMAX Take 1 capsule (0.4 mg total) by mouth daily after breakfast.        Allergies: No Known Allergies  Family History: Family History  Problem Relation Age of Onset   Hypertension Mother    Diabetes Mother    Hypertension Father    Glaucoma Father    Cancer Other    Breast cancer Paternal Aunt    Breast cancer Paternal Aunt     Social History:  reports that she has never smoked. She has never used smokeless tobacco. She reports current alcohol use. She reports that she does not use drugs.  ROS: Pertinent ROS in HPI  Physical Exam: BP (!) 98/58 (BP Location: [name] Arm, Patient Position: Sitting, Cuff Size: Normal)   Pulse 64   Temp 98.1 F (36.7 C) (Oral)   Ht '5\' 4"'$  (1.626 m)   Wt 149 lb (67.6 kg)   BMI 25.58 kg/m   Constitutional:  Well nourished. Alert and oriented, No acute distress. HEENT: [place] AT, moist mucus membranes.  Trachea midline, no masses. Cardiovascular: No clubbing, cyanosis, or edema. Respiratory: Normal respiratory effort, no increased work of  breathing. GU: Right CVA tenderness.  No bladder fullness or masses.  Neurologic: Grossly intact, no focal deficits, moving all 4 extremities. Psychiatric: Normal mood and affect.    Laboratory Data: Urinalysis See EPIC and HPI I have reviewed the labs.   Pertinent Imaging: *** I have independently reviewed the films.    Assessment & Plan:    1. Gross hematuria -UA 21-50 RBC's, pyuria and bacteriuria  -urine sent for culture -concern for renal hemorrhage as she has been taking her pain medication around-the-clock, continued gross hematuria and now with a drop in blood pressure -Stat CT urogram is pending  2. Suspected UTI -UA with hematuria, pyuria bacteriuria -Urine sent for culture -Rocephin 1 g IM is given here in office -Septra DS is refilled   3. Low BP -advised patient to stop her BP meds and the tamsulosin for now -continue to monitor BP at home -reviewed return precautions   Return for cysto/stent removal .  These notes generated with voice recognition software. I apologize for typographical errors.  [place], Painter [address]  [place], [place] [address] [phone]

## 2022-02-18 ENCOUNTER — Telehealth: Payer: Self-pay | Admitting: Urology

## 2022-02-18 ENCOUNTER — Encounter: Payer: Self-pay | Admitting: Urology

## 2022-02-18 NOTE — Telephone Encounter (Signed)
I spoke with Zoe [name] this morning and she is feeling much better today.  She did not take her BP meds and her BP was normotensive.  She will take one of her BP meds tomorrow and if she remains normotensive will resume taking both BP meds Thursday.

## 2022-02-19 ENCOUNTER — Ambulatory Visit (INDEPENDENT_AMBULATORY_CARE_PROVIDER_SITE_OTHER): Payer: BC Managed Care – PPO | Admitting: Urology

## 2022-02-19 VITALS — BP 131/75 | HR 72 | Ht 67.0 in | Wt 146.0 lb

## 2022-02-19 DIAGNOSIS — Z466 Encounter for fitting and adjustment of urinary device: Secondary | ICD-10-CM

## 2022-02-19 DIAGNOSIS — N200 Calculus of kidney: Secondary | ICD-10-CM

## 2022-02-19 LAB — MICROSCOPIC EXAMINATION: RBC, Urine: >30 /hpf — AB (ref 0–2)

## 2022-02-19 LAB — URINALYSIS, COMPLETE
Bilirubin, UA: NEGATIVE
Glucose, UA: NEGATIVE
Ketones, UA: NEGATIVE
Nitrite, UA: NEGATIVE
Specific Gravity, UA: 1.02 (ref 1.005–1.030)
Urobilinogen, Ur: 0.2 mg/dL (ref 0.2–1.0)
pH, UA: 5.5 (ref 5.0–7.5)

## 2022-02-19 LAB — URINE CULTURE: Culture: NO GROWTH

## 2022-02-21 NOTE — Progress Notes (Signed)
   Indications: Patient is 66 y.o., who is s/p ureteroscopic removal of a 12 mm right renal pelvic calculus 02/11/2022.  She saw general [facility] last week for flank pain possible UTI.  Urine culture was negative and she is feeling much better.  The patient is presenting today for stent removal.  Procedure:  Flexible Cystoscopy with stent removal ([address]4715)  Timeout was performed and the correct patient, procedure and participants were identified.    Description:  The patient was prepped and draped in the usual sterile fashion. Flexible cystosopy was performed.  The stent was visualized, grasped, and removed intact without difficulty. The patient tolerated the procedure well.  A single dose of oral antibiotics was given.  Complications:  None  Plan:  Call for fever or flank pain post stent removal Follow-up 6 months with KUB   John Giovanni, MD

## 2022-03-17 ENCOUNTER — Ambulatory Visit: Payer: BC Managed Care – PPO | Admitting: Urology

## 2022-03-17 ENCOUNTER — Ambulatory Visit
Admission: RE | Admit: 2022-03-17 | Discharge: 2022-03-17 | Disposition: A | Discharge status: Home / Self Care | Payer: BC Managed Care – PPO | Source: Ambulatory Visit | Attending: Physician Assistant | Admitting: Physician Assistant

## 2022-03-17 DIAGNOSIS — Z1231 Encounter for screening mammogram for malignant neoplasm of breast: Secondary | ICD-10-CM | POA: Insufficient documentation

## 2022-03-24 ENCOUNTER — Encounter: Payer: Self-pay | Admitting: Dermatology

## 2022-03-24 ENCOUNTER — Ambulatory Visit: Payer: BC Managed Care – PPO | Admitting: Dermatology

## 2022-03-24 VITALS — BP 136/87 | HR 72

## 2022-03-24 DIAGNOSIS — L578 Other skin changes due to chronic exposure to nonionizing radiation: Secondary | ICD-10-CM

## 2022-03-24 DIAGNOSIS — L821 Other seborrheic keratosis: Secondary | ICD-10-CM

## 2022-03-24 DIAGNOSIS — L820 Inflamed seborrheic keratosis: Secondary | ICD-10-CM

## 2022-03-24 DIAGNOSIS — D489 Neoplasm of uncertain behavior, unspecified: Secondary | ICD-10-CM

## 2022-03-24 NOTE — Patient Instructions (Addendum)
Biopsy Wound Care Instructions  Leave the original bandage on for 24 hours if possible.  If the bandage becomes soaked or soiled before that time, it is OK to remove it and examine the wound.  A small amount of post-operative bleeding is normal.  If excessive bleeding occurs, remove the bandage, place gauze over the site and apply continuous pressure (no peeking) over the area for 30 minutes. If this does not work, please call our clinic as soon as possible or page your doctor if it is after hours.   Once a day, cleanse the wound with soap and water. It is fine to shower. If a thick crust develops you may use a Q-tip dipped into dilute hydrogen peroxide (mix 1:1 with water) to dissolve it.  Hydrogen peroxide can slow the healing process, so use it only as needed.    After washing, apply petroleum jelly (Vaseline) or an antibiotic ointment if your doctor prescribed one for you, followed by a bandage.    For best healing, the wound should be covered with a layer of ointment at all times. If you are not able to keep the area covered with a bandage to hold the ointment in place, this may mean re-applying the ointment several times a day.  Continue this wound care until the wound has healed and is no longer open.   Itching and mild discomfort is normal during the healing process. However, if you develop pain or severe itching, please call our office.   If you have any discomfort, you can take Tylenol (acetaminophen) or ibuprofen as directed on the bottle. (Please do not take these if you have an allergy to them or cannot take them for another reason).  Some redness, tenderness and white or yellow material in the wound is normal healing.  If the area becomes very sore and red, or develops a thick yellow-green material (pus), it may be infected; please notify us.    If you have stitches, return to clinic as directed to have the stitches removed. You will continue wound care for 2-3 days after the stitches  are removed.   Wound healing continues for up to one year following surgery. It is not unusual to experience pain in the scar from time to time during the interval.  If the pain becomes severe or the scar thickens, you should notify the office.    A slight amount of redness in a scar is expected for the first six months.  After six months, the redness will fade and the scar will soften and fade.  The color difference becomes less noticeable with time.  If there are any problems, return for a post-op surgery check at your earliest convenience.  To improve the appearance of the scar, you can use silicone scar gel, cream, or sheets (such as Mederma or Serica) every night for up to one year. These are available over the counter (without a prescription).  Please call our office at [phone] for any questions or concerns.   Seborrheic Keratosis  What causes seborrheic keratoses? Seborrheic keratoses are harmless, common skin growths that first appear during adult life.  As time goes by, more growths appear.  Some people may develop a large number of them.  Seborrheic keratoses appear on both covered and uncovered body parts.  They are not caused by sunlight.  The tendency to develop seborrheic keratoses can be inherited.  They vary in color from skin-colored to gray, brown, or even black.  They can be   either smooth or have a rough, warty surface.   Seborrheic keratoses are superficial and look as if they were stuck on the skin.  Under the microscope this type of keratosis looks like layers upon layers of skin.  That is why at times the top layer may seem to fall off, but the rest of the growth remains and re-grows.    Treatment Seborrheic keratoses do not need to be treated, but can easily be removed in the office.  Seborrheic keratoses often cause symptoms when they rub on clothing or jewelry.  Lesions can be in the way of shaving.  If they become inflamed, they can cause itching, soreness, or  burning.  Removal of a seborrheic keratosis can be accomplished by freezing, burning, or surgery. If any spot bleeds, scabs, or grows rapidly, please return to have it checked, as these can be an indication of a skin cancer.  Cryotherapy Aftercare  Wash gently with soap and water everyday.   Apply Vaseline and Band-Aid daily until healed.       Due to recent changes in healthcare laws, you may see results of your pathology and/or laboratory studies on MyChart before the doctors have had a chance to review them. We understand that in some cases there may be results that are confusing or concerning to you. Please understand that not all results are received at the same time and often the doctors may need to interpret multiple results in order to provide you with the best plan of care or course of treatment. Therefore, we ask that you please give us 2 business days to thoroughly review all your results before contacting the office for clarification. Should we see a critical lab result, you will be contacted sooner.   If You Need Anything After Your Visit  If you have any questions or concerns for your doctor, please call our main line at [phone] and press option 4 to reach your doctor's medical assistant. If no one answers, please leave a voicemail as directed and we will return your call as soon as possible. Messages left after 4 pm will be answered the following business day.   You may also send us a message via MyChart. We typically respond to MyChart messages within 1-2 business days.  For prescription refills, please ask your pharmacy to contact our office. Our fax number is [phone].  If you have an urgent issue when the clinic is closed that cannot wait until the next business day, you can page your doctor at the number below.    Please note that while we do our best to be available for urgent issues outside of office hours, we are not available 24/7.   If you have an urgent  issue and are unable to reach us, you may choose to seek medical care at your doctor's office, retail clinic, urgent care center, or emergency room.  If you have a medical emergency, please immediately call 911 or go to the emergency department.  Pager Numbers  - Dr. Kowalski: [phone]  - Dr. Moye: [phone]  - Dr. Stewart: [phone]  In the event of inclement weather, please call our main line at [phone] for an update on the status of any delays or closures.  Dermatology Medication Tips: Please keep the boxes that topical medications come in in order to help keep track of the instructions about where and how to use these. Pharmacies typically print the medication instructions only on the boxes and not directly on the medication   tubes.   If your medication is too expensive, please contact our office at [phone] option 4 or send us a message through MyChart.   We are unable to tell what your co-pay for medications will be in advance as this is different depending on your insurance coverage. However, we may be able to find a substitute medication at lower cost or fill out paperwork to get insurance to cover a needed medication.   If a prior authorization is required to get your medication covered by your insurance company, please allow us 1-2 business days to complete this process.  Drug prices often vary depending on where the prescription is filled and some pharmacies may offer cheaper prices.  The website www.goodrx.com contains coupons for medications through different pharmacies. The prices here do not account for what the cost may be with help from insurance (it may be cheaper with your insurance), but the website can give you the price if you did not use any insurance.  - You can print the associated coupon and take it with your prescription to the pharmacy.  - You may also stop by our office during regular business hours and pick up a GoodRx coupon card.  - If you  need your prescription sent electronically to a different pharmacy, notify our office through [facility] MyChart or by phone at [phone] option 4.     Si Usted Necesita Algo Despus de Su Visita  Tambin puede enviarnos un mensaje a travs de MyChart. Por lo general respondemos a los mensajes de MyChart en el transcurso de 1 a 2 das hbiles.  Para renovar recetas, por favor pida a su farmacia que se ponga en contacto con nuestra oficina. Nuestro nmero de fax es el [phone].  Si tiene un asunto urgente cuando la clnica est cerrada y que no puede esperar hasta el siguiente da hbil, puede llamar/localizar a su doctor(a) al nmero que aparece a continuacin.   Por favor, tenga en cuenta que aunque hacemos todo lo posible para estar disponibles para asuntos urgentes fuera del horario de oficina, no estamos disponibles las 24 horas del da, los 7 das de la semana.   Si tiene un problema urgente y no puede comunicarse con nosotros, puede optar por buscar atencin mdica  en el consultorio de su doctor(a), en una clnica privada, en un centro de atencin urgente o en una sala de emergencias.  Si tiene una emergencia mdica, por favor llame inmediatamente al 911 o vaya a la sala de emergencias.  Nmeros de bper  - Dr. Kowalski: [phone]  - Dra. Moye: [phone]  - Dra. Stewart: [phone]  En caso de inclemencias del tiempo, por favor llame a nuestra lnea principal al [phone] para una actualizacin sobre el estado de cualquier retraso o cierre.  Consejos para la medicacin en dermatologa: Por favor, guarde las cajas en las que vienen los medicamentos de uso tpico para ayudarle a seguir las instrucciones sobre dnde y cmo usarlos. Las farmacias generalmente imprimen las instrucciones del medicamento slo en las cajas y no directamente en los tubos del medicamento.   Si su medicamento es muy caro, por favor, pngase en contacto con nuestra oficina llamando al  [phone] y presione la opcin 4 o envenos un mensaje a travs de MyChart.   No podemos decirle cul ser su copago por los medicamentos por adelantado ya que esto es diferente dependiendo de la cobertura de su seguro. Sin embargo, es posible que podamos encontrar un medicamento sustituto a menor costo   o llenar un formulario para que el seguro cubra el medicamento que se considera necesario.   Si se requiere una autorizacin previa para que su compaa de seguros cubra su medicamento, por favor permtanos de 1 a 2 das hbiles para completar este proceso.  Los precios de los medicamentos varan con frecuencia dependiendo del lugar de dnde se surte la receta y alguna farmacias pueden ofrecer precios ms baratos.  El sitio web www.goodrx.com tiene cupones para medicamentos de diferentes farmacias. Los precios aqu no tienen en cuenta lo que podra costar con la ayuda del seguro (puede ser ms barato con su seguro), pero el sitio web puede darle el precio si no utiliz ningn seguro.  - Puede imprimir el cupn correspondiente y llevarlo con su receta a la farmacia.  - Tambin puede pasar por nuestra oficina durante el horario de atencin regular y recoger una tarjeta de cupones de GoodRx.  - Si necesita que su receta se enve electrnicamente a una farmacia diferente, informe a nuestra oficina a travs de MyChart de [place] o por telfono llamando al [phone] y presione la opcin 4.  

## 2022-03-24 NOTE — Progress Notes (Signed)
   Follow-Up Visit   Subjective  Zoe [name] is a 67 y.o. female who presents for the following: OTHER (Patient here today concerning several spots that bother her when rubbed by clothing sometimes. She mentions spot at right face, back, left breast and gluteal crease area. ). The patient has spots, moles and lesions to be evaluated, some may be new or changing and the patient has concerns that these could be cancer.  The following portions of the chart were reviewed this encounter and updated as appropriate:  Tobacco  Allergies  Meds  Problems  Med Hx  Surg Hx  Fam Hx     Review of Systems: No other skin or systemic complaints except as noted in HPI or Assessment and Plan.  Objective  Well appearing patient in no apparent distress; mood and affect are within normal limits.  A focused examination was performed including back, right cheek, right mandible, left breast, superior gluteal crease. Relevant physical exam findings are noted in the Assessment and Plan.  left breast , back, superior gluteal crease Erythematous stuck-on, waxy papule or plaque  right mandible 0.6 cm dark brown papule       Assessment & Plan  Inflamed seborrheic keratosis left breast , back, superior gluteal crease Symptomatic, irritating, patient would like treated. Patient deferred treatment today.  Will wait to have areas treated at next follow up due to traveling   Neoplasm of uncertain behavior right mandible Epidermal / dermal shaving  Lesion diameter (cm):  0.6 Informed consent: discussed and consent obtained   Timeout: patient name, date of birth, surgical site, and procedure verified   Procedure prep:  Patient was prepped and draped in usual sterile fashion Prep type:  Isopropyl alcohol Anesthesia: the lesion was anesthetized in a standard fashion   Anesthetic:  1% lidocaine w/ epinephrine 1-100,000 buffered w/ 8.4% NaHCO3 Instrument used: flexible razor blade   Hemostasis  achieved with: pressure, aluminum chloride and electrodesiccation   Outcome: patient tolerated procedure well   Post-procedure details: sterile dressing applied and wound care instructions given   Dressing type: bandage and petrolatum    Specimen 1 - Surgical pathology Differential Diagnosis: R/o irritated seborrheic keratosis vs other Check Margins: No R/o irritated seborrheic keratosis vs other  Seborrheic Keratoses Multiple locations of chest, back - Stuck-on, waxy, tan-brown papules and/or plaques  - Benign-appearing - Discussed benign etiology and prognosis. - Observe - Call for any changes  Actinic Damage - chronic, secondary to cumulative UV radiation exposure/sun exposure over time - diffuse scaly erythematous macules with underlying dyspigmentation - Recommend daily broad spectrum sunscreen SPF 30+ to sun-exposed areas, reapply every 2 hours as needed.  - Recommend staying in the shade or wearing long sleeves, sun glasses (UVA+UVB protection) and wide brim hats (4-inch brim around the entire circumference of the hat). - Call for new or changing lesions.  Return for next available appointment isk follow up. IRuthell Rummage, CMA, am acting as scribe for Sarina Ser, MD. Documentation: I have reviewed the above documentation for accuracy and completeness, and I agree with the above.  Sarina Ser, MD

## 2022-03-27 ENCOUNTER — Telehealth: Payer: Self-pay

## 2022-03-27 NOTE — Telephone Encounter (Signed)
Left pt msg to call for bx results/sh 

## 2022-03-27 NOTE — Telephone Encounter (Signed)
-----  Message from Ralene Bathe, MD sent at 03/26/2022  6:43 PM EST ----- Diagnosis Skin , right mandible SEBORRHEIC KERATOSIS, IRRITATED  Benign irritated keratosis No further treatment needed

## 2022-03-27 NOTE — Telephone Encounter (Signed)
Advised patient of results/hd  

## 2022-04-02 ENCOUNTER — Encounter: Payer: Self-pay | Admitting: Dermatology

## 2022-05-08 ENCOUNTER — Encounter (INDEPENDENT_AMBULATORY_CARE_PROVIDER_SITE_OTHER): Payer: BC Managed Care – PPO | Admitting: Ophthalmology

## 2022-05-29 ENCOUNTER — Ambulatory Visit: Payer: BC Managed Care – PPO | Admitting: Dermatology

## 2022-08-26 ENCOUNTER — Other Ambulatory Visit: Payer: Self-pay | Admitting: Family Medicine

## 2022-08-26 DIAGNOSIS — N200 Calculus of kidney: Secondary | ICD-10-CM

## 2022-08-27 ENCOUNTER — Ambulatory Visit
Admission: RE | Admit: 2022-08-27 | Discharge: 2022-08-27 | Disposition: A | Discharge status: Home / Self Care | Payer: BC Managed Care – PPO | Source: Ambulatory Visit | Attending: Urology | Admitting: Urology

## 2022-08-27 ENCOUNTER — Ambulatory Visit (INDEPENDENT_AMBULATORY_CARE_PROVIDER_SITE_OTHER): Payer: BC Managed Care – PPO | Admitting: Urology

## 2022-08-27 ENCOUNTER — Encounter: Payer: Self-pay | Admitting: Urology

## 2022-08-27 ENCOUNTER — Ambulatory Visit
Admission: RE | Admit: 2022-08-27 | Discharge: 2022-08-27 | Disposition: A | Discharge status: Home / Self Care | Payer: BC Managed Care – PPO | Attending: Urology | Admitting: Urology

## 2022-08-27 VITALS — BP 123/78 | HR 71 | Ht 67.0 in | Wt 153.0 lb

## 2022-08-27 DIAGNOSIS — N200 Calculus of kidney: Secondary | ICD-10-CM

## 2022-08-27 NOTE — Progress Notes (Signed)
I, Duke Salvia, acting as a scribe for [name], MD., have documented all relevant documentation on the behalf of [name], MD, as directed by  [name], MD while in the presence of [name], MD.   08/27/2022 12:02 PM   [name] 08-22-1955 [id number]  Referring provider: Patrice Paradise, MD [address] [facility]  [place] [address]  Chief Complaint  Patient presents with   Follow-up   Nephrolithiasis    Urologic history:  1.  Recurrent nephrolithiasis Left ureteroscopic stone removal April 2015 CT 03/2019 bilateral, nonobstructing renal calculi L >R Metabolic evaluation 07/2019 low urine volume and hypocitraturia Ureteroscopic removal of a 12 mm right renal pelvic calculus.   HPI: 67 y.o. female presents for 6 month follow-up.  Doing well since last visit No bothersome LUTS Denies dysuria, gross hematuria Denies flank, abdominal or pelvic pain    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    Surgical History: Past Surgical History:  Procedure Laterality Date   CATARACT EXTRACTION W/ INTRAOCULAR LENS IMPLANT Left    CATARACT EXTRACTION W/PHACO Right 04/13/2013   Procedure: RIGHT CATARACT EXTRACTION PHACO AND INTRAOCULAR LENS PLACEMENT (IOC);  Surgeon: Chalmers Guest, MD;  Location: [facility] OR;  Service: Ophthalmology;  Laterality: Right;   COLONOSCOPY  01/06/2020   COLONOSCOPY     2010, 2016   CRYOABLATION     of cervix   CYSTOSCOPY W/ RETROGRADES  02/11/2022   Procedure:  CYSTOSCOPY WITH RETROGRADE PYELOGRAM;  Surgeon: [name], MD;  Location: [facility] ORS;  Service: Urology;;   CYSTOSCOPY/URETEROSCOPY/HOLMIUM LASER/STENT PLACEMENT Right 02/11/2022   Procedure: CYSTOSCOPY/URETEROSCOPY/HOLMIUM LASER/STENT PLACEMENT;  Surgeon: [name], MD;  Location: [facility] ORS;  Service: Urology;  Laterality: Right;   DILATION AND CURETTAGE OF UTERUS     ESOPHAGOGASTRODUODENOSCOPY  2010   KNEE ARTHROSCOPY     Hx: of right knee   RETINAL DETACHMENT SURGERY Right 2015   x2   SHOULDER ARTHROSCOPY W/ ROTATOR CUFF REPAIR Left 2010   TUBAL LIGATION  2012    Home Medications:  Allergies as of 08/27/2022   No Known Allergies      Medication List        Accurate as of August 27, 2022 12:02 PM. If you have any questions, ask your nurse or doctor.          STOP taking these medications    Cyanocobalamin 1000 MCG/15ML Liqd Stopped by: [name], MD   HYDROcodone-acetaminophen 5-325 MG tablet Commonly known as: NORCO/VICODIN Stopped by: [name], MD   Lumigan 0.01 % Soln Generic drug: bimatoprost Stopped by: [name], MD   oxybutynin 5 MG tablet Commonly known as: DITROPAN Stopped by: [name], MD   Rhopressa 0.02 % Soln Generic drug: Netarsudil Dimesylate Stopped by: [name], MD   sulfamethoxazole-trimethoprim 800-160 MG tablet  Commonly known as: BACTRIM DS Stopped by: [name], MD   tamsulosin 0.4 MG Caps capsule Commonly known as: FLOMAX Stopped by: [name], MD       TAKE these medications    acetaminophen 500 MG tablet Commonly known as: TYLENOL Take 500 mg by mouth every 6 (six) hours as needed for mild pain.   amLODipine 5 MG tablet Commonly known as: NORVASC Take 5 mg by mouth daily.   brimonidine-timolol 0.2-0.5 % ophthalmic solution Commonly known as: COMBIGAN Place 1 drop into both eyes every 12 (twelve) hours.   dorzolamide 2 % ophthalmic solution Commonly known as:  TRUSOPT Place 1 drop into both eyes daily.   ergocalciferol 1.25 MG (50000 UT) capsule Commonly known as: VITAMIN D2 Take 50,000 Units by mouth once a week. Sunday   HAIR SKIN & NAILS GUMMIES PO Take 2 each by mouth daily.   latanoprost 0.005 % ophthalmic solution Commonly known as: XALATAN 1 drop at bedtime.   losartan 100 MG tablet Commonly known as: COZAAR Take 100 mg by mouth daily.        Family History: Family History  Problem Relation Age of Onset   Hypertension Mother    Diabetes Mother    Hypertension Father    Glaucoma Father    Cancer Other    Breast cancer Paternal Aunt    Breast cancer Paternal Aunt     Social History:  reports that she has never smoked. She has never used smokeless tobacco. She reports current alcohol use. She reports that she does not use drugs.   Physical Exam: BP 123/78   Pulse 71   Ht 5\' 7"  (1.702 m)   Wt 153 lb (69.4 kg)   BMI 23.96 kg/m   Constitutional:  Alert and oriented, No acute distress. HEENT: [place] AT, moist mucus membranes.  Trachea midline, no masses. Cardiovascular: No clubbing, cyanosis, or edema. Respiratory: Normal respiratory effort, no increased work of breathing.  Pertinent imaging: KB performed today, reviewed and the left renal calculus has changed in configuration and may have migrated to the renal pelvis.   Assessment & Plan:    1.  Left nephrolithiasis Possible migration to left renal pelvis. Schedule CT renal stone study and will call with results.  I have reviewed the above documentation for accuracy and completeness, and I agree with the above.   [name], MD  [facility] [address] [place], [place] [address] [phone]

## 2022-09-18 ENCOUNTER — Ambulatory Visit
Admission: RE | Admit: 2022-09-18 | Discharge: 2022-09-18 | Disposition: A | Discharge status: Home / Self Care | Payer: BC Managed Care – PPO | Source: Ambulatory Visit | Attending: Urology | Admitting: Urology

## 2022-09-18 DIAGNOSIS — N200 Calculus of kidney: Secondary | ICD-10-CM | POA: Insufficient documentation

## 2022-09-24 ENCOUNTER — Telehealth: Payer: Self-pay | Admitting: Urology

## 2022-09-24 ENCOUNTER — Ambulatory Visit: Payer: BC Managed Care – PPO | Admitting: Dermatology

## 2022-09-24 DIAGNOSIS — N200 Calculus of kidney: Secondary | ICD-10-CM

## 2022-09-24 NOTE — Telephone Encounter (Signed)
Please let patient know her CT does show migration of her left renal stone into the renal pelvis.  Although it presently is not causing obstruction the stone can grow and if this happens slowly can cause obstruction without her having symptoms with subsequent loss of renal function.  Recommend scheduling ureteroscopic stone removal.  If she wants to discuss further before scheduling schedule office visit otherwise I will send orders to [facility].  Please let me know if she has any questions.

## 2022-10-13 NOTE — Telephone Encounter (Signed)
Notified patient as instructed,Patient would like to have ureteroscopic stone removal .

## 2022-10-16 NOTE — Addendum Note (Signed)
Addended by: [name] on: 10/16/2022 05:19 PM   Modules accepted: Orders

## 2022-10-17 ENCOUNTER — Telehealth: Payer: Self-pay

## 2022-10-17 ENCOUNTER — Other Ambulatory Visit: Payer: Self-pay

## 2022-10-17 DIAGNOSIS — N200 Calculus of kidney: Secondary | ICD-10-CM

## 2022-10-17 NOTE — Progress Notes (Signed)
   [facility] Surgical Posting Form  Surgery Date: Date: 11/11/2022  Surgeon: Dr. Irineo Axon, MD  Inpt ( No  )   Outpt (Yes)   Obs ( No  )   Diagnosis: N20.0 Left Nephrolithiasis   -CPT: 19147  Surgery: Left Ureteroscopy with Laser Lithotripsy and Stent Placement  Stop Anticoagulations: N/A  Cardiac/Medical/Pulmonary Clearance needed: no  *Orders entered into EPIC  Date: 10/17/22   *Case booked in [place]  Date: 10/17/22  *Notified pt of Surgery: Date: 10/17/22  PRE-OP UA & CX: yes, will obtain in clinic on 10/30/2022  *Placed into Prior Authorization Work Angela Nevin Date: 10/17/22  Assistant/laser/rep:No

## 2022-10-17 NOTE — Progress Notes (Signed)
Surgical Physician Order Form [facility]  Dr. Irineo Axon, MD  * Scheduling expectation : Next Available  *Length of Case: 90 minutes  *Clearance needed: no  *Anticoagulation Instructions: N/A  *Aspirin Instructions: N/A  *Post-op visit Date/Instructions:  1 week cysto stent removal  *Diagnosis: Left Nephrolithiasis  *Procedure: Left Ureteroscopy w/laser lithotripsy & stent placement (10272)   Additional orders: N/A  -Admit type: OUTpatient  -Anesthesia: General  -VTE Prophylaxis Standing Order SCD's       Other:   -Standing Lab Orders Per Anesthesia    Lab other: UA&Urine Culture  -Standing Test orders EKG/Chest x-ray per Anesthesia       Test other:   - Medications:  Ancef 2gm IV  -Other orders:  N/A

## 2022-10-17 NOTE — Telephone Encounter (Signed)
Per Dr. Lonna Cobb, Patient is to be scheduled for Left Ureteroscopy with Laser Lithotripsy and Stent Placement   Zoe [name] was contacted and possible surgical dates were discussed, Tuesday September 10th, 2024 was agreed upon for surgery.   Patient was instructed that Dr. Lonna Cobb will require them to provide a pre-op UA & CX prior to surgery. This was ordered and scheduled drop off appointment was made for 10/30/2022.    Patient was directed to call [phone] between 1-3pm the day before surgery to find out surgical arrival time.    Instructions were given not to eat or drink from midnight on the night before surgery and have a driver for the day of surgery. On the surgery day patient was instructed to enter through the Medical Mall entrance of [facility] report the Same Day Surgery desk.   Pre-Admit Testing will be in contact via phone to set up an interview with the anesthesia team to review your history and medications prior to surgery.   Reminder of this information was sent via MyChart to the patient.

## 2022-10-30 ENCOUNTER — Other Ambulatory Visit: Payer: BC Managed Care – PPO

## 2022-11-04 ENCOUNTER — Encounter
Admission: RE | Admit: 2022-11-04 | Discharge: 2022-11-04 | Disposition: A | Discharge status: Home / Self Care | Payer: BC Managed Care – PPO | Source: Ambulatory Visit | Attending: Urology | Admitting: Urology

## 2022-11-04 ENCOUNTER — Other Ambulatory Visit: Payer: Self-pay

## 2022-11-04 HISTORY — DX: Primary open-angle glaucoma, bilateral, moderate stage: H40.1132

## 2022-11-04 NOTE — Patient Instructions (Addendum)
Your procedure is scheduled on: 11/11/22 - Tuesday Report to the Registration Desk on the 1st floor of the Medical Mall. To find out your arrival time, please call [phone] between 1PM - 3PM on: 11/10/22 - Monday If your arrival time is 6:00 am, do not arrive before that time as the Medical Mall entrance doors do not open until 6:00 am.  REMEMBER: Instructions that are not followed completely may result in serious medical risk, up to and including death; or upon the discretion of your surgeon and anesthesiologist your surgery may need to be rescheduled.  Do not eat food or drink any liquids after midnight the night before surgery.  No gum chewing or hard candies.  One week prior to surgery: Stop Anti-inflammatories (NSAIDS) such as Advil, Aleve, Ibuprofen, Motrin, Naproxen, Naprosyn and Aspirin based products such as Excedrin, Goody's Powder, BC Powder. You may take Tylenol if needed for pain up until the day of surgery.  Stop ANY OVER THE COUNTER supplements until after surgery.   TAKE ONLY THESE MEDICATIONS THE MORNING OF SURGERY WITH A SIP OF WATER:  amLODipine (NORVASC)  dorzolamide (TRUSOPT)  brimonidine-timolol (COMBIGAN)    No Alcohol for 24 hours before or after surgery.  No Smoking including e-cigarettes for 24 hours before surgery.  No chewable tobacco products for at least 6 hours before surgery.  No nicotine patches on the day of surgery.  Do not use any "recreational" drugs for at least a week (preferably 2 weeks) before your surgery.  Please be advised that the combination of cocaine and anesthesia may have negative outcomes, up to and including death. If you test positive for cocaine, your surgery will be cancelled.  On the morning of surgery brush your teeth with toothpaste and water, you may rinse your mouth with mouthwash if you wish. Do not swallow any toothpaste or mouthwash.  Do not wear jewelry, make-up, hairpins, clips or nail polish.  Do not wear  lotions, powders, or perfumes.   Do not shave body hair from the neck down 48 hours before surgery.  Contact lenses, hearing aids and dentures may not be worn into surgery.  Do not bring valuables to the hospital. [facility] is not responsible for any missing/lost belongings or valuables.   Notify your doctor if there is any change in your medical condition (cold, fever, infection).  Wear comfortable clothing (specific to your surgery type) to the hospital.  After surgery, you can help prevent lung complications by doing breathing exercises.  Take deep breaths and cough every 1-2 hours. Your doctor may order a device called an Incentive Spirometer to help you take deep breaths. When coughing or sneezing, hold a pillow firmly against your incision with both hands. This is called "splinting." Doing this helps protect your incision. It also decreases belly discomfort.  If you are being admitted to the hospital overnight, leave your suitcase in the car. After surgery it may be brought to your room.  In case of increased patient census, it may be necessary for you, the patient, to continue your postoperative care in the Same Day Surgery department.  If you are being discharged the day of surgery, you will not be allowed to drive home. You will need a responsible individual to drive you home and stay with you for 24 hours after surgery.   If you are taking public transportation, you will need to have a responsible individual with you.  Please call the Pre-admissions Testing Dept. at [phone] if you  have any questions about these instructions.  Surgery Visitation Policy:  Patients having surgery or a procedure may have two visitors.  Children under the age of 49 must have an adult with them who is not the patient.  Inpatient Visitation:    Visiting hours are 7 a.m. to 8 p.m. Up to four visitors are allowed at one time in a patient room. The visitors may rotate out with other people  during the day.  One visitor age 4 or older may stay with the patient overnight and must be in the room by 8 p.m.

## 2022-11-10 MED ORDER — ORAL CARE MOUTH RINSE: 15.0000 mL | Freq: Once | OROMUCOSAL | Status: AC

## 2022-11-10 MED ORDER — LACTATED RINGERS IV SOLN: INTRAVENOUS | Status: DC

## 2022-11-10 MED ORDER — CHLORHEXIDINE GLUCONATE 0.12 % MT SOLN
15.0000 mL | Freq: Once | OROMUCOSAL | Status: AC
  Administered 2022-11-11: 15 mL via OROMUCOSAL

## 2022-11-10 MED ORDER — CEFAZOLIN SODIUM-DEXTROSE 2-4 GM/100ML-% IV SOLN
2.0000 g | INTRAVENOUS | Status: AC
  Administered 2022-11-11: 2 g via INTRAVENOUS

## 2022-11-10 MED ORDER — FAMOTIDINE 20 MG PO TABS
20.0000 mg | ORAL_TABLET | Freq: Once | ORAL | Status: AC
  Administered 2022-11-11: 20 mg via ORAL

## 2022-11-11 ENCOUNTER — Ambulatory Visit: Payer: BC Managed Care – PPO

## 2022-11-11 ENCOUNTER — Ambulatory Visit: Payer: BC Managed Care – PPO | Admitting: Urgent Care

## 2022-11-11 ENCOUNTER — Other Ambulatory Visit: Payer: Self-pay | Admitting: Urology

## 2022-11-11 ENCOUNTER — Other Ambulatory Visit: Payer: Self-pay

## 2022-11-11 ENCOUNTER — Encounter: Payer: Self-pay | Admitting: Urology

## 2022-11-11 ENCOUNTER — Ambulatory Visit
Admission: RE | Admit: 2022-11-11 | Discharge: 2022-11-11 | Disposition: A | Discharge status: Home / Self Care | Payer: BC Managed Care – PPO | Attending: Urology | Admitting: Urology

## 2022-11-11 ENCOUNTER — Encounter
Admission: RE | Disposition: A | Discharge status: Home / Self Care | Payer: Self-pay | Source: Home / Self Care | Attending: Urology

## 2022-11-11 DIAGNOSIS — N200 Calculus of kidney: Secondary | ICD-10-CM | POA: Diagnosis present

## 2022-11-11 DIAGNOSIS — I10 Essential (primary) hypertension: Secondary | ICD-10-CM | POA: Insufficient documentation

## 2022-11-11 DIAGNOSIS — N201 Calculus of ureter: Secondary | ICD-10-CM

## 2022-11-11 DIAGNOSIS — Z79899 Other long term (current) drug therapy: Secondary | ICD-10-CM | POA: Diagnosis not present

## 2022-11-11 DIAGNOSIS — H401132 Primary open-angle glaucoma, bilateral, moderate stage: Secondary | ICD-10-CM | POA: Diagnosis not present

## 2022-11-11 HISTORY — PX: CYSTOSCOPY/URETEROSCOPY/HOLMIUM LASER/STENT PLACEMENT: SHX6546

## 2022-11-11 SURGERY — CYSTOSCOPY/URETEROSCOPY/HOLMIUM LASER/STENT PLACEMENT
Anesthesia: General | Site: Ureter | Laterality: Left

## 2022-11-11 MED ORDER — ACETAMINOPHEN 10 MG/ML IV SOLN
INTRAVENOUS | Status: DC | PRN
Start: 2022-11-11 — End: 2022-11-11
  Administered 2022-11-11: 1000 mg via INTRAVENOUS

## 2022-11-11 MED ORDER — STERILE WATER FOR IRRIGATION IR SOLN
Status: DC | PRN
Start: 2022-11-11 — End: 2022-11-11
  Administered 2022-11-11: 500 mL

## 2022-11-11 MED ORDER — DEXAMETHASONE SODIUM PHOSPHATE 10 MG/ML IJ SOLN
INTRAMUSCULAR | Status: AC
  Filled 2022-11-11: qty 2

## 2022-11-11 MED ORDER — GLYCOPYRROLATE 0.2 MG/ML IJ SOLN
INTRAMUSCULAR | Status: AC
  Filled 2022-11-11: qty 7

## 2022-11-11 MED ORDER — HYDROCODONE-ACETAMINOPHEN 5-325 MG PO TABS
1.0000 | ORAL_TABLET | Freq: Four times a day (QID) | ORAL | 0 refills | Status: DC | PRN
Start: 2022-11-11 — End: 2022-11-11

## 2022-11-11 MED ORDER — FENTANYL CITRATE (PF) 100 MCG/2ML IJ SOLN
INTRAMUSCULAR | Status: AC
  Filled 2022-11-11: qty 2

## 2022-11-11 MED ORDER — SODIUM CHLORIDE 0.9 % IR SOLN
Status: DC | PRN
  Administered 2022-11-11: 3000 mL

## 2022-11-11 MED ORDER — ROCURONIUM BROMIDE 10 MG/ML (PF) SYRINGE
PREFILLED_SYRINGE | INTRAVENOUS | Status: AC
  Filled 2022-11-11: qty 10

## 2022-11-11 MED ORDER — FENTANYL CITRATE (PF) 100 MCG/2ML IJ SOLN
INTRAMUSCULAR | Status: DC | PRN
  Administered 2022-11-11 (×2): 50 ug via INTRAVENOUS

## 2022-11-11 MED ORDER — OXYCODONE HCL 5 MG PO TABS
ORAL_TABLET | ORAL | Status: AC
  Filled 2022-11-11: qty 1

## 2022-11-11 MED ORDER — FENTANYL CITRATE (PF) 100 MCG/2ML IJ SOLN: 25.0000 ug | INTRAMUSCULAR | Status: DC | PRN

## 2022-11-11 MED ORDER — DEXAMETHASONE SODIUM PHOSPHATE 10 MG/ML IJ SOLN
INTRAMUSCULAR | Status: DC | PRN
  Administered 2022-11-11: 10 mg via INTRAVENOUS

## 2022-11-11 MED ORDER — ONDANSETRON HCL 4 MG/2ML IJ SOLN
INTRAMUSCULAR | Status: DC | PRN
  Administered 2022-11-11 (×2): 4 mg via INTRAVENOUS

## 2022-11-11 MED ORDER — LIDOCAINE HCL (CARDIAC) PF 100 MG/5ML IV SOSY
PREFILLED_SYRINGE | INTRAVENOUS | Status: DC | PRN
  Administered 2022-11-11: 100 mg via INTRAVENOUS

## 2022-11-11 MED ORDER — SUGAMMADEX SODIUM 200 MG/2ML IV SOLN
INTRAVENOUS | Status: DC | PRN
  Administered 2022-11-11: 200 mg via INTRAVENOUS
  Administered 2022-11-11: 50 mg via INTRAVENOUS

## 2022-11-11 MED ORDER — FAMOTIDINE 20 MG PO TABS
ORAL_TABLET | ORAL | Status: AC
  Filled 2022-11-11: qty 1

## 2022-11-11 MED ORDER — ACETAMINOPHEN 10 MG/ML IV SOLN
INTRAVENOUS | Status: AC
  Filled 2022-11-11: qty 100

## 2022-11-11 MED ORDER — HYDROCODONE-ACETAMINOPHEN 5-325 MG PO TABS: 1.0000 | ORAL_TABLET | Freq: Four times a day (QID) | ORAL | 0 refills | Status: DC | PRN

## 2022-11-11 MED ORDER — IOHEXOL 180 MG/ML  SOLN
INTRAMUSCULAR | Status: DC | PRN
  Administered 2022-11-11: 14.5 mL

## 2022-11-11 MED ORDER — OXYBUTYNIN CHLORIDE 5 MG PO TABS: ORAL_TABLET | ORAL | 0 refills | Status: DC

## 2022-11-11 MED ORDER — DEXMEDETOMIDINE HCL IN NACL 80 MCG/20ML IV SOLN
INTRAVENOUS | Status: DC | PRN
Start: 2022-11-11 — End: 2022-11-11
  Administered 2022-11-11 (×2): 10 ug via INTRAVENOUS

## 2022-11-11 MED ORDER — CEFAZOLIN SODIUM-DEXTROSE 2-4 GM/100ML-% IV SOLN
INTRAVENOUS | Status: AC
  Filled 2022-11-11: qty 100

## 2022-11-11 MED ORDER — ONDANSETRON HCL 4 MG/2ML IJ SOLN
INTRAMUSCULAR | Status: AC
  Filled 2022-11-11: qty 10

## 2022-11-11 MED ORDER — GLYCOPYRROLATE 0.2 MG/ML IJ SOLN
INTRAMUSCULAR | Status: DC | PRN
  Administered 2022-11-11: .2 mg via INTRAVENOUS

## 2022-11-11 MED ORDER — CHLORHEXIDINE GLUCONATE 0.12 % MT SOLN
OROMUCOSAL | Status: AC
  Filled 2022-11-11: qty 15

## 2022-11-11 MED ORDER — PROPOFOL 10 MG/ML IV BOLUS
INTRAVENOUS | Status: AC
  Filled 2022-11-11: qty 20

## 2022-11-11 MED ORDER — ROCURONIUM BROMIDE 100 MG/10ML IV SOLN
INTRAVENOUS | Status: DC | PRN
  Administered 2022-11-11: 50 mg via INTRAVENOUS

## 2022-11-11 MED ORDER — TAMSULOSIN HCL 0.4 MG PO CAPS: 0.4000 mg | ORAL_CAPSULE | Freq: Every day | ORAL | 0 refills | Status: DC

## 2022-11-11 MED ORDER — OXYCODONE HCL 5 MG/5ML PO SOLN: 5.0000 mg | Freq: Once | ORAL | Status: AC | PRN

## 2022-11-11 MED ORDER — PROPOFOL 10 MG/ML IV BOLUS
INTRAVENOUS | Status: DC | PRN
  Administered 2022-11-11: 150 mg via INTRAVENOUS

## 2022-11-11 MED ORDER — OXYCODONE HCL 5 MG PO TABS
5.0000 mg | ORAL_TABLET | Freq: Once | ORAL | Status: AC | PRN
  Administered 2022-11-11: 5 mg via ORAL

## 2022-11-11 SURGICAL SUPPLY — 29 items
BAG DRAIN SIEMENS DORNER NS (MISCELLANEOUS) ×1 IMPLANT
BAG DRN NS LF (MISCELLANEOUS) ×1
BASKET ZERO TIP 1.9FR (BASKET) IMPLANT
BRUSH SCRUB EZ 1% IODOPHOR (MISCELLANEOUS) ×1 IMPLANT
BSKT STON RTRVL ZERO TP 1.9FR (BASKET)
CATH URET FLEX-TIP 2 LUMEN 10F (CATHETERS) IMPLANT
CATH URETL OPEN END 6X70 (CATHETERS) IMPLANT
CNTNR URN SCR LID CUP LEK RST (MISCELLANEOUS) IMPLANT
CONT SPEC 4OZ STRL OR WHT (MISCELLANEOUS)
DRAPE UTILITY 15X26 TOWEL STRL (DRAPES) ×1 IMPLANT
FIBER LASER MOSES 200 DFL (Laser) IMPLANT
GLOVE BIOGEL PI IND STRL 7.5 (GLOVE) ×1 IMPLANT
GOWN STRL REUS W/ TWL LRG LVL3 (GOWN DISPOSABLE) ×1 IMPLANT
GOWN STRL REUS W/ TWL XL LVL3 (GOWN DISPOSABLE) ×1 IMPLANT
GOWN STRL REUS W/TWL LRG LVL3 (GOWN DISPOSABLE) ×1
GOWN STRL REUS W/TWL XL LVL3 (GOWN DISPOSABLE) ×1
GUIDEWIRE GREEN .038 145CM (MISCELLANEOUS) IMPLANT
GUIDEWIRE STR DUAL SENSOR (WIRE) ×1 IMPLANT
IV NS IRRIG 3000ML ARTHROMATIC (IV SOLUTION) ×1 IMPLANT
KIT TURNOVER CYSTO (KITS) ×1 IMPLANT
PACK CYSTO AR (MISCELLANEOUS) ×1 IMPLANT
SET CYSTO W/LG BORE CLAMP LF (SET/KITS/TRAYS/PACK) ×1 IMPLANT
SHEATH NAVIGATOR HD 12/14X36 (SHEATH) IMPLANT
STENT URET 6FRX22 CONTOUR (STENTS) IMPLANT
STENT URET 6FRX24 CONTOUR (STENTS) IMPLANT
STENT URET 6FRX26 CONTOUR (STENTS) IMPLANT
SURGILUBE 2OZ TUBE FLIPTOP (MISCELLANEOUS) ×1 IMPLANT
VALVE UROSEAL ADJ ENDO (VALVE) IMPLANT
WATER STERILE IRR 500ML POUR (IV SOLUTION) ×1 IMPLANT

## 2022-11-11 NOTE — Anesthesia Preprocedure Evaluation (Addendum)
Anesthesia Evaluation  Patient identified by MRN, date of birth, ID band Patient awake    Reviewed: Allergy & Precautions, NPO status , Patient's Chart, lab work & pertinent test results  History of Anesthesia Complications Negative for: history of anesthetic complications  Airway Mallampati: II  TM Distance: >3 FB Neck ROM: full    Dental no notable dental hx.    Pulmonary neg pulmonary ROS   Pulmonary exam normal        Cardiovascular hypertension, On Medications Normal cardiovascular exam     Neuro/Psych  PSYCHIATRIC DISORDERS  Depression    negative neurological ROS     GI/Hepatic negative GI ROS, Neg liver ROS,,,  Endo/Other  negative endocrine ROS    Renal/GU Renal disease     Musculoskeletal   Abdominal   Peds  Hematology negative hematology ROS (+)   Anesthesia Other Findings Past Medical History: No date: Cataracts, bilateral No date: Depression No date: Diverticulosis 11/15/2019: Extrusion of scleral buckle     Comment:  Exposed stitch in the past superonasal quadrant,               resected No date: Glaucoma No date: Heart palpitations No date: History of kidney stones No date: Hypertension No date: Kidney stone No date: Lamellar macular hole of right eye No date: Primary open angle glaucoma of both eyes, moderate stage No date: Retinal detachment, right No date: Syncope     Comment:  Hx; of one episode No date: Umbilical hernia     Comment:  at birth  Past Surgical History: No date: CATARACT EXTRACTION W/ INTRAOCULAR LENS IMPLANT; Left 04/13/2013: CATARACT EXTRACTION W/PHACO; Right     Comment:  Procedure: RIGHT CATARACT EXTRACTION PHACO AND               INTRAOCULAR LENS PLACEMENT (IOC);  Surgeon: Chalmers Guest,              MD;  Location: [facility] OR;  Service: Ophthalmology;                Laterality: Right; 01/06/2020: COLONOSCOPY No date: COLONOSCOPY     Comment:  2010, 2016 No date:  CRYOABLATION     Comment:  of cervix 02/11/2022: CYSTOSCOPY W/ RETROGRADES     Comment:  Procedure: CYSTOSCOPY WITH RETROGRADE PYELOGRAM;                Surgeon: [name], MD;  Location: [facility] ORS;                Service: Urology;; 02/11/2022: CYSTOSCOPY/URETEROSCOPY/HOLMIUM LASER/STENT PLACEMENT;  Right     Comment:  Procedure: CYSTOSCOPY/URETEROSCOPY/HOLMIUM LASER/STENT               PLACEMENT;  Surgeon: [name], MD;  Location:               [facility] ORS;  Service: Urology;  Laterality: Right; No date: DILATION AND CURETTAGE OF UTERUS 2010: ESOPHAGOGASTRODUODENOSCOPY No date: KNEE ARTHROSCOPY     Comment:  Hx: of right knee 2015: RETINAL DETACHMENT SURGERY; Right     Comment:  x2 2010: SHOULDER ARTHROSCOPY W/ ROTATOR CUFF REPAIR; Left 2012: TUBAL LIGATION     Reproductive/Obstetrics negative OB ROS                             Anesthesia Physical Anesthesia Plan  ASA: 2  Anesthesia Plan: General LMA   Post-op Pain Management: Ofirmev IV (intra-op)* and Toradol  IV (intra-op)*   Induction: Intravenous  PONV Risk Score and Plan: 3 and Dexamethasone, Ondansetron, Midazolam and Treatment may vary due to age or medical condition  Airway Management Planned: Oral ETT  Additional Equipment:   Intra-op Plan:   Post-operative Plan: Extubation in OR  Informed Consent: I have reviewed the patients History and Physical, chart, labs and discussed the procedure including the risks, benefits and alternatives for the proposed anesthesia with the patient or authorized representative who has indicated his/her understanding and acceptance.     Dental Advisory Given  Plan Discussed with: Anesthesiologist, CRNA and Surgeon  Anesthesia Plan Comments: (Patient consented for risks of anesthesia including but not limited to:  - adverse reactions to medications - damage to eyes, teeth, lips or other oral mucosa - nerve damage due to positioning  -  sore throat or hoarseness - Damage to heart, brain, nerves, lungs, other parts of body or loss of life  Patient voiced understanding.)       Anesthesia Quick Evaluation

## 2022-11-11 NOTE — Anesthesia Procedure Notes (Signed)
Procedure Name: Intubation Date/Time: 11/11/2022 9:56 AM  Performed by: Mohammed Kindle, CRNAPre-anesthesia Checklist: Patient identified, Emergency Drugs available, Suction available and Patient being monitored Patient Re-evaluated:Patient Re-evaluated prior to induction Oxygen Delivery Method: Circle system utilized Preoxygenation: Pre-oxygenation with 100% oxygen Induction Type: IV induction Ventilation: Mask ventilation without difficulty Laryngoscope Size: McGraph and 3 Grade View: Grade I Tube type: Oral Tube size: 6.5 mm Number of attempts: 1 Airway Equipment and Method: Stylet Placement Confirmation: ETT inserted through vocal cords under direct vision, positive ETCO2, breath sounds checked- equal and bilateral and CO2 detector Secured at: 21 cm Tube secured with: Tape Dental Injury: Teeth and Oropharynx as per pre-operative assessment  Comments: Inserted by Hassel Neth

## 2022-11-11 NOTE — Discharge Instructions (Addendum)
AMBULATORY SURGERY  DISCHARGE INSTRUCTIONS   The drugs that you were given will stay in your system until tomorrow so for the next 24 hours you should not:  Drive an automobile Make any legal decisions Drink any alcoholic beverage   You may resume regular meals tomorrow.  Today it is better to start with liquids and gradually work up to solid foods.  You may eat anything you prefer, but it is better to start with liquids, then soup and crackers, and gradually work up to solid foods.   Please notify your doctor immediately if you have any unusual bleeding, trouble breathing, redness and pain at the surgery site, drainage, fever, or pain not relieved by medication.   Your post-operative visit with Dr.                                     is: Date:                        Time:    Please call to schedule your post-operative visit.  Additional Instructions:  DISCHARGE INSTRUCTIONS FOR KIDNEY STONE/URETERAL STENT   MEDICATIONS:  1. Resume all your other meds from home.  2.  AZO (over-the-counter) can help with the burning/stinging when you urinate. 3.  Hydrocodone is for moderate/severe pain, Rx was sent to your pharmacy. 4.  Rxs for oxybutynin and tamsulosin which can help with stent/bladder irritation were also sent to your pharmacy and  ACTIVITY:  1. May resume regular activities in 24 hours. 2. No driving while on narcotic pain medications  3. Drink plenty of water  4. Continue to walk at home - you can still get blood clots when you are at home, so keep active, but don't over do it.  5. May return to work/school tomorrow or when you feel ready   SIGNS/SYMPTOMS TO CALL:  Common postoperative symptoms include urinary frequency, urgency, bladder spasm and blood in the urine  Please call us if you have a fever greater than 101.5, uncontrolled nausea/vomiting, uncontrolled pain, dizziness, unable to urinate, excessively bloody urine, chest pain, shortness of breath, leg  swelling, leg pain, or any other concerns or questions.   You can reach [place] at [phone].   FOLLOW-UP:  1. You will be contacted for a follow-up appointment for stent removal

## 2022-11-11 NOTE — Transfer of Care (Signed)
Immediate Anesthesia Transfer of Care Note  Patient: Zoe [name]  Procedure(s) Performed: CYSTOSCOPY/URETEROSCOPY/HOLMIUM LASER/STENT PLACEMENT (Left: Ureter)  Patient Location: PACU  Anesthesia Type:General  Level of Consciousness: awake, drowsy, and patient cooperative  Airway & Oxygen Therapy: Patient Spontanous Breathing and Patient connected to face mask oxygen  Post-op Assessment: Report given to RN and Post -op Vital signs reviewed and stable  Post vital signs: Reviewed and stable  Last Vitals:  Vitals Value Taken Time  BP 141/77 11/11/22 1139  Temp    Pulse 73 11/11/22 1140  Resp 13 11/11/22 1140  SpO2 99 % 11/11/22 1140  Vitals shown include unfiled device data.  Last Pain:  Vitals:   11/11/22 0843  TempSrc: Temporal  PainSc: 0-No pain      Patients Stated Pain Goal: 0 (11/11/22 0843)  Complications: No notable events documented.

## 2022-11-11 NOTE — Interval H&P Note (Signed)
History and Physical Interval Note:  11/11/2022 9:56 AM  Zoe [name]  has presented today for surgery, with the diagnosis of Left Nephrolithiasis.  The various methods of treatment have been discussed with the patient and family. After consideration of risks, benefits and other options for treatment, the patient has consented to  Procedure(s): CYSTOSCOPY/URETEROSCOPY/HOLMIUM LASER/STENT PLACEMENT (Left) as a surgical intervention.  The patient's history has been reviewed, patient examined, no change in status, stable for surgery.  I have reviewed the patient's chart and labs.  Questions were answered to the patient's satisfaction.    CV: RRR Lungs: Clear  Vilas Edgerly C Yuvia Plant

## 2022-11-11 NOTE — H&P (Signed)
Urology H&P  Urologic history:   1.  Recurrent nephrolithiasis Left ureteroscopic stone removal April 2015 CT 03/2019 bilateral, nonobstructing renal calculi L >R Metabolic evaluation 07/2019 low urine volume and hypocitraturia Ureteroscopic removal of a 12 mm right renal pelvic calculus.  History of Present Illness: Zoe [name] is a 67 y.o. female who on recent KUB found to have migration of a 11 x 16 mm left renal calculus to the renal pelvis.  Although asymptomatic due to stone location treatment has been recommended.  She also has a left lower pole renal calculi.  Past Medical History:  Diagnosis Date   Cataracts, bilateral    Depression    Diverticulosis    Extrusion of scleral buckle 11/15/2019   Exposed stitch in the past superonasal quadrant, resected   Glaucoma    Heart palpitations    History of kidney stones    Hypertension    Kidney stone    Lamellar macular hole of right eye    Primary open angle glaucoma of both eyes, moderate stage    Retinal detachment, right    Syncope    Hx; of one episode   Umbilical hernia    at birth    Past Surgical History:  Procedure Laterality Date   CATARACT EXTRACTION W/ INTRAOCULAR LENS IMPLANT Left    CATARACT EXTRACTION W/PHACO Right 04/13/2013   Procedure: RIGHT CATARACT EXTRACTION PHACO AND INTRAOCULAR LENS PLACEMENT (IOC);  Surgeon: Chalmers Guest, MD;  Location: [facility] OR;  Service: Ophthalmology;  Laterality: Right;   COLONOSCOPY  01/06/2020   COLONOSCOPY     2010, 2016   CRYOABLATION     of cervix   CYSTOSCOPY W/ RETROGRADES  02/11/2022   Procedure: CYSTOSCOPY WITH RETROGRADE PYELOGRAM;  Surgeon: [name], MD;  Location: [facility] ORS;  Service: Urology;;   CYSTOSCOPY/URETEROSCOPY/HOLMIUM LASER/STENT PLACEMENT Right 02/11/2022   Procedure: CYSTOSCOPY/URETEROSCOPY/HOLMIUM LASER/STENT PLACEMENT;  Surgeon: [name], MD;  Location: [facility] ORS;  Service: Urology;  Laterality: Right;   DILATION AND CURETTAGE OF  UTERUS     ESOPHAGOGASTRODUODENOSCOPY  2010   KNEE ARTHROSCOPY     Hx: of right knee   RETINAL DETACHMENT SURGERY Right 2015   x2   SHOULDER ARTHROSCOPY W/ ROTATOR CUFF REPAIR Left 2010   TUBAL LIGATION  2012    Home Medications:  Current Meds  Medication Sig   acetaminophen (TYLENOL) 500 MG tablet Take 500 mg by mouth every 6 (six) hours as needed for mild pain.   amLODipine (NORVASC) 5 MG tablet Take 5 mg by mouth daily.   brimonidine-timolol (COMBIGAN) 0.2-0.5 % ophthalmic solution Place 1 drop into both eyes every 12 (twelve) hours.   dorzolamide (TRUSOPT) 2 % ophthalmic solution Place 1 drop into both eyes 3 (three) times daily.   ergocalciferol (VITAMIN D2) 1.25 MG (50000 UT) capsule Take 50,000 Units by mouth once a week. Sunday   latanoprost (XALATAN) 0.005 % ophthalmic solution 1 drop at bedtime.   losartan (COZAAR) 100 MG tablet Take 100 mg by mouth daily.    Allergies: No Known Allergies  Family History  Problem Relation Age of Onset   Hypertension Mother    Diabetes Mother    Hypertension Father    Glaucoma Father    Cancer Other    Breast cancer Paternal Aunt    Breast cancer Paternal Aunt     Social History:  reports that she has never smoked. She has never used smokeless tobacco. She reports that she does not currently use alcohol.  She reports that she does not use drugs.  ROS: No fever, chills, shortness of breath  Physical Exam:  Vital signs in last 24 hours: Temp:  [97.1 F (36.2 C)] 97.1 F (36.2 C) (09/10 0843) Pulse Rate:  [81] 81 (09/10 0843) Resp:  [16] 16 (09/10 0843) BP: (148)/(86) 148/86 (09/10 0843) SpO2:  [100 %] 100 % (09/10 0843) Weight:  [67.6 kg] 67.6 kg (09/10 0843) Constitutional:  Alert and oriented, No acute distress HEENT:  AT Psychiatric: Normal mood and affect   Laboratory Data:  No results for input(s): "WBC", "HGB", "HCT" in the last 72 hours. No results for input(s): "NA", "K", "CL", "CO2", "GLUCOSE", "BUN",  "CREATININE", "CALCIUM" in the last 72 hours. No results for input(s): "LABPT", "INR" in the last 72 hours. No results for input(s): "LABURIN" in the last 72 hours. Results for orders placed or performed in visit on 02/19/22  Microscopic Examination     Status: Abnormal   Collection Time: 02/19/22  2:22 PM   Urine  Result Value Ref Range Status   WBC, UA 11-30 (A) 0 - 5 /hpf Final   RBC, Urine >30 (A) 0 - 2 /hpf Final   Epithelial Cells (non renal) 0-10 0 - 10 /hpf Final   Bacteria, UA Moderate (A) None seen/Few Final    Impression/Assessment:  Left renal pelvic calculus  Plan:  Left ureteroscopy with laser lithotripsy/stone removal The procedure has been discussed in detail including potential risks of bleeding, infection and injury to the ureter/kidney.  We discussed the small chance the stone cannot be treated due to inability to access the renal pelvis secondary to ureteral anatomy.  If this were to occur she would require ureteral stent placement and follow-up ureteroscopy after a period of stent dilation.  The possibility of remaining stone fragments was also discussed.  All questions were answered and she desires to proceed   11/11/2022, 9:56 AM  Irineo Axon,  MD

## 2022-11-11 NOTE — Op Note (Signed)
Preoperative diagnosis: Left nephrolithiasis   Postoperative diagnosis: Left nephrolithiasis  Procedure:  Cystoscopy Left ureteroscopy and stone removal Ureteroscopic laser lithotripsy Left ureteral stent placement (63F/24 cm)  Left retrograde pyelography with interpretation  Surgeon: Lorin Picket C. Shreyan Hinz, M.D.  Anesthesia: General  Complications: None  Intraoperative findings: Cystoscopy-bladder mucosa normal in appearance without erythema, solid or papillary lesions.  UOs normal-appearing bilaterally Ureteropyeloscopy-no mucosal abnormalities or calculi on ureteroscopy.  Stone left renal pelvis and lower calyceal calculi largest measuring ~ 5 mm Left retrograde pyelography demonstrated a filling defect within the renal pelvis consistent with the patient's known calculus without other abnormalities. Retrograde pyelography post procedure showed no filling defects, stone fragments or contrast extravasation  EBL: Minimal  Specimens: None   Indication: Zoe [name] is a 67 y.o. with a history of left lower pole calculi.  Although asymptomatic on recent imaging she was found to have migration of a 16 mm calculus to the left renal pelvis without obstruction.  After reviewing the management options for treatment, the patient elected to proceed with the above surgical procedure(s). We have discussed the potential benefits and risks of the procedure, side effects of the proposed treatment, the likelihood of the patient achieving the goals of the procedure, and any potential problems that might occur during the procedure or recuperation. Informed consent has been obtained.  Description of procedure:  The patient was taken to the operating room and general anesthesia was induced.  The patient was placed in the dorsal lithotomy position, prepped and draped in the usual sterile fashion, and preoperative antibiotics were administered. A preoperative time-out was performed.   A 22 French  cystoscope was lubricated and passed under direct vision.  The urethra was normal in caliber without stricture.  The prostate demonstrated *** lateral lobe enlargement and ***median lobe/bladder neck.  Panendoscopy was performed and the bladder mucosa showed no erythema, solid or papillary lesions.  Attention then turned to the *** ureteral orifice and a ureteral catheter was used to intubate the ureteral orifice.  Omnipaque contrast was injected through the ureteral catheter and a retrograde pyelogram was performed with findings as dictated above.  Attention was directed to the *** ureteral orifice and a 0.038 Sensor wire was then advanced up the *** ureter into the renal pelvis under fluoroscopic guidance.  The cystoscope was removed and a dual-lumen catheter was placed over the sensor wire and a second sensor wire was placed in a similar fashion.  A 12/14 French ureteral access sheath was placed over the working wire under fluoroscopic guidance without difficulty.  A digital flexible ureteroscope was placed through the access sheath and advanced into the renal pelvis without difficulty.  The calculus was identified ***.    A 273 micron holmium laser fiber was placed through the ureteroscope and the stone was dusted/fragmented at a setting of ***J and frequency of *** Hz.   All stone fragments were then removed from the collecting system with a zero tip nitinol basket.  Retrograde pyelogram was performed and each calyx was sequentially examined under fluoroscopic guidance and no significant size fragments were identified.  The ureteral access sheath and ureteroscope were removed in tandem and the ureter showed no evidence of injury or perforation.  The wire was then backloaded through the cystoscope and a ureteral stent was advance over the wire using Seldinger technique. A *** FR/ *** CM stent was was placed under fluoroscopic guidance.  The wire was then removed with an adequate stent curl noted  in the  renal pelvis as well as in the bladder.  The bladder was then emptied and the procedure ended.  The patient appeared to tolerate the procedure well and without complications.  After anesthetic reversal the patient was transported to the PACU in stable condition.

## 2022-11-12 ENCOUNTER — Encounter: Payer: Self-pay | Admitting: Urology

## 2022-11-12 NOTE — Anesthesia Postprocedure Evaluation (Signed)
Anesthesia Post Note  Patient: Zoe [name]  Procedure(s) Performed: CYSTOSCOPY/URETEROSCOPY/HOLMIUM LASER/STENT PLACEMENT (Left: Ureter)  Patient location during evaluation: PACU Anesthesia Type: General Level of consciousness: awake and alert Pain management: pain level controlled Vital Signs Assessment: post-procedure vital signs reviewed and stable Respiratory status: spontaneous breathing, nonlabored ventilation, respiratory function stable and patient connected to nasal cannula oxygen Cardiovascular status: blood pressure returned to baseline and stable Postop Assessment: no apparent nausea or vomiting Anesthetic complications: no   No notable events documented.   Last Vitals:  Vitals:   11/11/22 1230 11/11/22 1256  BP: (!) 167/75 (!) 161/80  Pulse: 64 74  Resp: 14 16  Temp: (!) 36.4 C   SpO2: 100% 99%    Last Pain:  Vitals:   11/11/22 1256  TempSrc:   PainSc: 8                  Louie Boston

## 2022-11-13 ENCOUNTER — Telehealth: Payer: Self-pay

## 2022-11-13 NOTE — Telephone Encounter (Signed)
Patient left message on triage line stating that she had cystoscopy with stent placement on 11/11/22. Since late yesterday into early this morning she has noticed she is having more kidney pain that she has not been having until now. She started taking Norco at 6 am this morning and then went to sleep. She will take another Norco here shortly. Pain is a little better with this medication. She is taking Flomax and Oxybutynin still. She noticed some darker brownish/red color in the urine also but no clots. Advised patient to continue current regimen, push clear fluids. Advised the more physical activity she is doing the more pain she may experience from the stent. Advised patient if her pain gets worse and/or not managed with the medication to let us know. She is not having fever or vomiting.

## 2022-11-14 ENCOUNTER — Other Ambulatory Visit: Payer: Self-pay

## 2022-11-14 DIAGNOSIS — N200 Calculus of kidney: Secondary | ICD-10-CM

## 2022-11-20 ENCOUNTER — Ambulatory Visit (INDEPENDENT_AMBULATORY_CARE_PROVIDER_SITE_OTHER): Payer: BC Managed Care – PPO | Admitting: Urology

## 2022-11-20 ENCOUNTER — Ambulatory Visit
Admission: RE | Admit: 2022-11-20 | Discharge: 2022-11-20 | Disposition: A | Discharge status: Home / Self Care | Payer: BC Managed Care – PPO | Source: Ambulatory Visit | Attending: Urology | Admitting: Urology

## 2022-11-20 ENCOUNTER — Encounter: Payer: Self-pay | Admitting: Urology

## 2022-11-20 ENCOUNTER — Ambulatory Visit
Admission: RE | Admit: 2022-11-20 | Discharge: 2022-11-20 | Disposition: A | Discharge status: Home / Self Care | Payer: BC Managed Care – PPO | Attending: Urology | Admitting: Urology

## 2022-11-20 VITALS — BP 106/57 | HR 70 | Ht 64.0 in | Wt 150.0 lb

## 2022-11-20 DIAGNOSIS — N200 Calculus of kidney: Secondary | ICD-10-CM

## 2022-11-20 DIAGNOSIS — Z466 Encounter for fitting and adjustment of urinary device: Secondary | ICD-10-CM | POA: Diagnosis not present

## 2022-11-20 LAB — MICROSCOPIC EXAMINATION
Epithelial Cells (non renal): >10 /hpf — AB (ref 0–10)
RBC, Urine: >30 /hpf — AB (ref 0–2)

## 2022-11-20 LAB — URINALYSIS, COMPLETE
Bilirubin, UA: NEGATIVE
Glucose, UA: NEGATIVE
Ketones, UA: NEGATIVE
Nitrite, UA: NEGATIVE
Specific Gravity, UA: 1.025 (ref 1.005–1.030)
Urobilinogen, Ur: 1 mg/dL (ref 0.2–1.0)
pH, UA: 7 (ref 5.0–7.5)

## 2022-11-20 MED ORDER — SULFAMETHOXAZOLE-TRIMETHOPRIM 800-160 MG PO TABS
1.0000 | ORAL_TABLET | Freq: Once | ORAL | Status: AC
Start: 2022-11-20 — End: 2022-11-20
  Administered 2022-11-20: 1 via ORAL

## 2022-11-20 NOTE — Progress Notes (Signed)
   Indications: Patient is 67 y.o., who is s/p ureteroscopic removal of a left renal pelvic calculus and lower calyceal calculi 11/11/2022.  The patient is presenting today for stent removal.  KUB performed earlier today was personally reviewed.  There are remaining calyceal fragments with 1 possible 4 mm fragment near the tip of her stent.  A minute fragment alongside the stent distally.  We discussed she may develop renal colic if there are any significant sized fragments after stent removal stent removal.  We discussed the alternative of second look ureteroscopy.  She would like to proceed with stent removal and understands possibility of renal colic.  Procedure:  Flexible Cystoscopy with stent removal (51884)  Timeout was performed and the correct patient, procedure and participants were identified.    Description:  The patient was prepped and draped in the usual sterile fashion. Flexible cystosopy was performed.  The stent was visualized, grasped, and removed intact without difficulty. The patient tolerated the procedure well.  A single dose of oral antibiotics was given.  Complications:  None  Plan:  Call for renal colic/fever post stent removal Schedule I month follow-up with KUB  Irineo Axon, MD

## 2023-01-02 ENCOUNTER — Ambulatory Visit
Admission: RE | Admit: 2023-01-02 | Discharge: 2023-01-02 | Disposition: A | Discharge status: Home / Self Care | Payer: BC Managed Care – PPO | Source: Ambulatory Visit | Attending: Urology | Admitting: Urology

## 2023-01-02 ENCOUNTER — Ambulatory Visit
Admission: RE | Admit: 2023-01-02 | Discharge: 2023-01-02 | Disposition: A | Discharge status: Home / Self Care | Payer: BC Managed Care – PPO | Attending: Urology | Admitting: Urology

## 2023-01-02 ENCOUNTER — Encounter: Payer: Self-pay | Admitting: Urology

## 2023-01-02 ENCOUNTER — Ambulatory Visit: Payer: BC Managed Care – PPO | Admitting: Urology

## 2023-01-02 VITALS — BP 143/81 | HR 76 | Ht 64.0 in | Wt 150.0 lb

## 2023-01-02 DIAGNOSIS — Z09 Encounter for follow-up examination after completed treatment for conditions other than malignant neoplasm: Secondary | ICD-10-CM | POA: Diagnosis not present

## 2023-01-02 DIAGNOSIS — N200 Calculus of kidney: Secondary | ICD-10-CM | POA: Diagnosis present

## 2023-01-02 DIAGNOSIS — Z87442 Personal history of urinary calculi: Secondary | ICD-10-CM

## 2023-01-02 NOTE — Progress Notes (Signed)
I, Zoe [name], acting as a scribe for Zoe Altes, MD., have documented all relevant documentation on the behalf of Zoe Altes, MD, as directed by Zoe Altes, MD while in the presence of Zoe Altes, MD.  01/02/2023 4:10 PM   [name] Dec 22, 1955 [id number]  Referring provider: Patrice Paradise, MD [address] [facility] [place],  [place] [address]  Chief Complaint  Patient presents with   Nephrolithiasis   Urologic history:  1.  Recurrent nephrolithiasis Left ureteroscopic stone removal April 2015 CT 03/2019 bilateral, nonobstructing renal calculi L >R Metabolic evaluation 07/2019 low urine volume and hypocitraturia Ureteroscopic removal of a 12 mm right renal pelvic calculus. Ureteroscopic removal of a 5 mm renal pelvic calculus 11/11/2022  HPI: Zoe [name] is a 67 y.o. female presents for 1 month follow-up.   Status post ureteroscopic removal of a left renal pelvic calculus and lower caliceal calculi 11/11/2022.  At a time of her stent removal, there were lower pole fragments and a possible 4 mm fragment alongside her stent. She had no problems after stent removal and denied renal colic. She has no complaints today.    PMH: Past Medical History:  Diagnosis Date   Cataracts, bilateral    Depression    Diverticulosis    Extrusion of scleral buckle 11/15/2019   Exposed stitch in the past superonasal quadrant, resected   Glaucoma    Heart palpitations    History of kidney stones    Hypertension    Kidney stone    Lamellar macular hole of right eye    Primary open angle glaucoma of both eyes, moderate stage    Retinal detachment, right    Syncope    Hx; of one episode   Umbilical hernia    at birth    Surgical History: Past Surgical History:  Procedure Laterality Date   CATARACT EXTRACTION W/ INTRAOCULAR LENS IMPLANT Left    CATARACT EXTRACTION W/PHACO Right 04/13/2013   Procedure: RIGHT CATARACT  EXTRACTION PHACO AND INTRAOCULAR LENS PLACEMENT (IOC);  Surgeon: Chalmers Guest, MD;  Location: [facility] OR;  Service: Ophthalmology;  Laterality: Right;   COLONOSCOPY  01/06/2020   COLONOSCOPY     2010, 2016   CRYOABLATION     of cervix   CYSTOSCOPY W/ RETROGRADES  02/11/2022   Procedure: CYSTOSCOPY WITH RETROGRADE PYELOGRAM;  Surgeon: Zoe Altes, MD;  Location: [facility] ORS;  Service: Urology;;   CYSTOSCOPY/URETEROSCOPY/HOLMIUM LASER/STENT PLACEMENT Right 02/11/2022   Procedure: CYSTOSCOPY/URETEROSCOPY/HOLMIUM LASER/STENT PLACEMENT;  Surgeon: Zoe Altes, MD;  Location: [facility] ORS;  Service: Urology;  Laterality: Right;   CYSTOSCOPY/URETEROSCOPY/HOLMIUM LASER/STENT PLACEMENT Left 11/11/2022   Procedure: CYSTOSCOPY/URETEROSCOPY/HOLMIUM LASER/STENT PLACEMENT;  Surgeon: Zoe Altes, MD;  Location: [facility] ORS;  Service: Urology;  Laterality: Left;   DILATION AND CURETTAGE OF UTERUS     ESOPHAGOGASTRODUODENOSCOPY  2010   KNEE ARTHROSCOPY     Hx: of right knee   RETINAL DETACHMENT SURGERY Right 2015   x2   SHOULDER ARTHROSCOPY W/ ROTATOR CUFF REPAIR Left 2010   TUBAL LIGATION  2012    Home Medications:  Allergies as of 01/02/2023   No Known Allergies      Medication List        Accurate as of January 02, 2023  4:10 PM. If you have any questions, ask your nurse or doctor.          acetaminophen 500 MG tablet Commonly known as: TYLENOL Take 500 mg  by mouth every 6 (six) hours as needed for mild pain.   amLODipine 5 MG tablet Commonly known as: NORVASC Take 5 mg by mouth daily.   brimonidine-timolol 0.2-0.5 % ophthalmic solution Commonly known as: COMBIGAN Place 1 drop into both eyes every 12 (twelve) hours.   dorzolamide 2 % ophthalmic solution Commonly known as: TRUSOPT Place 1 drop into both eyes 3 (three) times daily.   ergocalciferol 1.25 MG (50000 UT) capsule Commonly known as: VITAMIN D2 Take 50,000 Units by mouth once a week. Sunday   latanoprost 0.005 %  ophthalmic solution Commonly known as: XALATAN 1 drop at bedtime.   losartan 100 MG tablet Commonly known as: COZAAR Take 100 mg by mouth daily.        Allergies: No Known Allergies  Family History: Family History  Problem Relation Age of Onset   Hypertension Mother    Diabetes Mother    Hypertension Father    Glaucoma Father    Cancer Other    Breast cancer Paternal Aunt    Breast cancer Paternal Aunt     Social History:  reports that she has never smoked. She has never used smokeless tobacco. She reports that she does not currently use alcohol. She reports that she does not use drugs.   Physical Exam: BP (!) 143/81   Pulse 76   Ht 5\' 4"  (1.626 m)   Wt 150 lb (68 kg)   BMI 25.75 kg/m   Constitutional:  Alert, No acute distress. HEENT: [place] AT Respiratory: Normal respiratory effort, no increased work of breathing. Psychiatric: Normal mood and affect.   Pertinent Imaging: KUB performed earlier today was personally reviewed and interpreted. There are fragments in the lower pole. No definite ureteral fragments are identified.    Assessment & Plan:    1. Status post ureteroscopic removal of a 5 mm renal pelvic calculus She underwent fragmentation of lower calyceal calculi, although these fragments appear to have settled within a lower pole calyx.  Schedule renal ultrasound to evaluate for hydronephrosis.  6 month follow-up with KUB.   I have reviewed the above documentation for accuracy and completeness, and I agree with the above.   Zoe Altes, MD  [facility] [address] [place], [place] [address] [phone]

## 2023-01-04 ENCOUNTER — Encounter: Payer: Self-pay | Admitting: Urology

## 2023-02-02 ENCOUNTER — Ambulatory Visit: Payer: BC Managed Care – PPO | Attending: Urology

## 2023-02-12 ENCOUNTER — Ambulatory Visit: Payer: BC Managed Care – PPO | Admitting: Dermatology

## 2023-02-12 DIAGNOSIS — L821 Other seborrheic keratosis: Secondary | ICD-10-CM | POA: Diagnosis not present

## 2023-02-12 DIAGNOSIS — L820 Inflamed seborrheic keratosis: Secondary | ICD-10-CM | POA: Diagnosis not present

## 2023-02-12 DIAGNOSIS — Z7189 Other specified counseling: Secondary | ICD-10-CM

## 2023-02-12 NOTE — Patient Instructions (Addendum)

## 2023-02-12 NOTE — Progress Notes (Signed)
   Follow-Up Visit   Subjective  Zoe [name] is a 67 y.o. female who presents for the following: patient here for follow up on isks. Patient reports some bothersome at back.     The patient has spots, moles and lesions to be evaluated, some may be new or changing and the patient may have concern these could be cancer.   The following portions of the chart were reviewed this encounter and updated as appropriate: medications, allergies, medical history  Review of Systems:  No other skin or systemic complaints except as noted in HPI or Assessment and Plan.  Objective  Well appearing patient in no apparent distress; mood and affect are within normal limits.  A focused examination was performed of the following areas: Back, face    Relevant exam findings are noted in the Assessment and Plan.  Mid Back x 1 Erythematous stuck-on, waxy papule or plaque  Assessment & Plan   SEBORRHEIC KERATOSIS Multiple areas at back  - Stuck-on, waxy, tan-brown papules and/or plaques  - Benign-appearing - Discussed benign etiology and prognosis. - Observe - Call for any changes  Discussed if bothersome can freeze, electrodessication and shave removal     INFLAMED SEBORRHEIC KERATOSIS Mid Back x 1 Symptomatic, irritating, patient would like treated. Destruction of lesion - Mid Back x 1 Complexity: simple   Destruction method: cryotherapy   Informed consent: discussed and consent obtained   Timeout:  patient name, date of birth, surgical site, and procedure verified Lesion destroyed using liquid nitrogen: Yes   Region frozen until ice ball extended beyond lesion: Yes   Outcome: patient tolerated procedure well with no complications   Post-procedure details: wound care instructions given    Return for isk follow up and tbse .  IAsher Muir, CMA, am acting as scribe for Armida Sans, MD.   Documentation: I have reviewed the above documentation for accuracy and completeness,  and I agree with the above.  Armida Sans, MD

## 2023-02-17 ENCOUNTER — Encounter: Payer: Self-pay | Admitting: Dermatology

## 2023-04-09 ENCOUNTER — Ambulatory Visit: Payer: BC Managed Care – PPO | Admitting: Dermatology

## 2023-04-13 ENCOUNTER — Other Ambulatory Visit: Payer: Self-pay | Admitting: Physician Assistant

## 2023-04-13 DIAGNOSIS — Z1231 Encounter for screening mammogram for malignant neoplasm of breast: Secondary | ICD-10-CM

## 2023-05-01 ENCOUNTER — Ambulatory Visit
Admission: RE | Admit: 2023-05-01 | Discharge: 2023-05-01 | Disposition: A | Discharge status: Home / Self Care | Payer: BC Managed Care – PPO | Source: Ambulatory Visit | Attending: Physician Assistant | Admitting: Physician Assistant

## 2023-05-01 DIAGNOSIS — Z1231 Encounter for screening mammogram for malignant neoplasm of breast: Secondary | ICD-10-CM | POA: Diagnosis present

## 2023-07-02 ENCOUNTER — Ambulatory Visit (INDEPENDENT_AMBULATORY_CARE_PROVIDER_SITE_OTHER): Payer: Self-pay | Admitting: Urology

## 2023-07-02 ENCOUNTER — Ambulatory Visit
Admission: RE | Admit: 2023-07-02 | Discharge: 2023-07-02 | Disposition: A | Discharge status: Home / Self Care | Source: Ambulatory Visit | Attending: Urology | Admitting: Urology

## 2023-07-02 ENCOUNTER — Encounter: Payer: Self-pay | Admitting: Urology

## 2023-07-02 ENCOUNTER — Ambulatory Visit
Admission: RE | Admit: 2023-07-02 | Discharge: 2023-07-02 | Disposition: A | Discharge status: Home / Self Care | Attending: Urology | Admitting: Urology

## 2023-07-02 VITALS — BP 115/73 | HR 72 | Ht 64.0 in | Wt 148.0 lb

## 2023-07-02 DIAGNOSIS — N200 Calculus of kidney: Secondary | ICD-10-CM

## 2023-07-02 NOTE — Progress Notes (Signed)
 I, Maysun Jamey Mccallum, acting as a scribe for Zoe Knapp, MD., have documented all relevant documentation on the behalf of Zoe Knapp, MD, as directed by Zoe Knapp, MD while in the presence of Zoe Knapp, MD.  07/02/2023 2:40 PM   Zoe [name] Oct 10, 1955 [id number]  Referring provider: Delmus Ferri, MD [address] [facility]  [place] [address]  Chief Complaint  Patient presents with   Nephrolithiasis   Urologic history:  1.  Recurrent nephrolithiasis Left ureteroscopic stone removal April 2015 CT 03/2019 bilateral, nonobstructing renal calculi L >R Metabolic evaluation 07/2019 low urine volume and hypocitraturia Ureteroscopic removal of a 12 mm right renal pelvic calculus. Ureteroscopic removal of a 5 mm renal pelvic calculus 11/11/2022  HPI: Zoe [name] is a 68 y.o. female presents for a 6 month follow-up.  She was apparently not contacted by radiology to schedule her recommended renal ultrasound.  Has had no flank pain. She did have mild lower abdominal discomfort associated with cloudy and malodorous urine Urinalysis on 06/18/23 which showed a significant pyuria. Urine culture, was positive for Citrobacter, which was pan-sensitive. She was started on Macrobid with resolution of her symptoms.    PMH: Past Medical History:  Diagnosis Date   Cataracts, bilateral    Depression    Diverticulosis    Extrusion of scleral buckle 11/15/2019   Exposed stitch in the past superonasal quadrant, resected   Glaucoma    Heart palpitations    History of kidney stones    Hypertension    Kidney stone    Lamellar macular hole of right eye    Primary open angle glaucoma of both eyes, moderate stage    Retinal detachment, right    Syncope    Hx; of one episode   Umbilical hernia    at birth    Surgical History: Past Surgical History:  Procedure Laterality Date   CATARACT EXTRACTION W/ INTRAOCULAR LENS IMPLANT Left     CATARACT EXTRACTION W/PHACO Right 04/13/2013   Procedure: RIGHT CATARACT EXTRACTION PHACO AND INTRAOCULAR LENS PLACEMENT (IOC);  Surgeon: Ben Bracken, MD;  Location: [facility] OR;  Service: Ophthalmology;  Laterality: Right;   COLONOSCOPY  01/06/2020   COLONOSCOPY     2010, 2016   CRYOABLATION     of cervix   CYSTOSCOPY W/ RETROGRADES  02/11/2022   Procedure: CYSTOSCOPY WITH RETROGRADE PYELOGRAM;  Surgeon: Zoe Knapp, MD;  Location: [facility] ORS;  Service: Urology;;   CYSTOSCOPY/URETEROSCOPY/HOLMIUM LASER/STENT PLACEMENT Right 02/11/2022   Procedure: CYSTOSCOPY/URETEROSCOPY/HOLMIUM LASER/STENT PLACEMENT;  Surgeon: Zoe Knapp, MD;  Location: [facility] ORS;  Service: Urology;  Laterality: Right;   CYSTOSCOPY/URETEROSCOPY/HOLMIUM LASER/STENT PLACEMENT Left 11/11/2022   Procedure: CYSTOSCOPY/URETEROSCOPY/HOLMIUM LASER/STENT PLACEMENT;  Surgeon: Zoe Knapp, MD;  Location: [facility] ORS;  Service: Urology;  Laterality: Left;   DILATION AND CURETTAGE OF UTERUS     ESOPHAGOGASTRODUODENOSCOPY  2010   KNEE ARTHROSCOPY     Hx: of right knee   RETINAL DETACHMENT SURGERY Right 2015   x2   SHOULDER ARTHROSCOPY W/ ROTATOR CUFF REPAIR Left 2010   TUBAL LIGATION  2012    Home Medications:  Allergies as of 07/02/2023   No Known Allergies      Medication List        Accurate as of Jul 02, 2023  2:40 PM. If you have any questions, ask your nurse or doctor.          acetaminophen  500 MG tablet Commonly  known as: TYLENOL  Take 500 mg by mouth every 6 (six) hours as needed for mild pain.   amLODipine 5 MG tablet Commonly known as: NORVASC Take 5 mg by mouth daily.   brimonidine-timolol 0.2-0.5 % ophthalmic solution Commonly known as: COMBIGAN Place 1 drop into both eyes every 12 (twelve) hours.   dorzolamide 2 % ophthalmic solution Commonly known as: TRUSOPT Place 1 drop into both eyes 3 (three) times daily.   ergocalciferol 1.25 MG (50000 UT) capsule Commonly known as: VITAMIN D2 Take  50,000 Units by mouth once a week. Sunday   latanoprost 0.005 % ophthalmic solution Commonly known as: XALATAN 1 drop at bedtime.   losartan 100 MG tablet Commonly known as: COZAAR Take 100 mg by mouth daily.        Allergies: No Known Allergies  Family History: Family History  Problem Relation Age of Onset   Hypertension Mother    Diabetes Mother    Hypertension Father    Glaucoma Father    Cancer Other    Breast cancer Paternal Aunt    Breast cancer Paternal Aunt     Social History:  reports that she has never smoked. She has never used smokeless tobacco. She reports that she does not currently use alcohol. She reports that she does not use drugs.   Physical Exam: BP 115/73   Pulse 72   Ht 5\' 4"  (1.626 m)   Wt 148 lb (67.1 kg)   BMI 25.40 kg/m   Constitutional:  Alert and oriented, No acute distress. HEENT: [place] AT, moist mucus membranes.  Trachea midline, no masses. Cardiovascular: No clubbing, cyanosis, or edema. Respiratory: Normal respiratory effort, no increased work of breathing. GI: Abdomen is soft, nontender, nondistended, no abdominal masses Skin: No rashes, bruises or suspicious lesions. Neurologic: Grossly intact, no focal deficits, moving all 4 extremities. Psychiatric: Normal mood and affect.   Assessment & Plan:    1. Recurrent nephrolithiasis KUB today and will notify with results.  If no significant abnormalities, 1 year follow-up with KUB.  [facility] [address] [place], [place] [address] [phone]

## 2023-07-03 ENCOUNTER — Other Ambulatory Visit: Payer: Self-pay | Admitting: *Deleted

## 2023-07-03 DIAGNOSIS — N200 Calculus of kidney: Secondary | ICD-10-CM

## 2023-07-05 ENCOUNTER — Encounter: Payer: Self-pay | Admitting: Urology

## 2023-07-28 ENCOUNTER — Encounter: Payer: Self-pay | Admitting: Urology

## 2023-07-28 ENCOUNTER — Ambulatory Visit: Admitting: Urology

## 2023-07-28 VITALS — BP 157/70 | HR 69 | Ht 64.0 in | Wt 146.0 lb

## 2023-07-28 DIAGNOSIS — N200 Calculus of kidney: Secondary | ICD-10-CM | POA: Diagnosis not present

## 2023-07-28 DIAGNOSIS — R3129 Other microscopic hematuria: Secondary | ICD-10-CM

## 2023-07-28 DIAGNOSIS — R3989 Other symptoms and signs involving the genitourinary system: Secondary | ICD-10-CM

## 2023-07-28 LAB — MICROSCOPIC EXAMINATION: WBC, UA: >30 /HPF — AB (ref 0–5)

## 2023-07-28 LAB — URINALYSIS, COMPLETE
Bilirubin, UA: NEGATIVE
Glucose, UA: NEGATIVE
Ketones, UA: NEGATIVE
Nitrite, UA: NEGATIVE
Protein,UA: NEGATIVE
Specific Gravity, UA: 1.02 (ref 1.005–1.030)
Urobilinogen, Ur: 0.2 mg/dL (ref 0.2–1.0)
pH, UA: 6 (ref 5.0–7.5)

## 2023-07-28 LAB — BLADDER SCAN AMB NON-IMAGING

## 2023-07-28 NOTE — Progress Notes (Signed)
 07/28/2023 9:44 AM   Zoe [name] 23-Sep-1955 [id number]  Referring provider: Delmus Ferri, MD [address] [facility] [place],  [place] [address]  Urological history: 1.  Nephrolithiasis - Left URS (2015) - Right URS (2023) - Left URS (2024)   2.  High risk hematuria - Non-smoker - Noncontrast CT (2023) -nephrolithiasis - cysto (2023) - NED  Chief Complaint  Patient presents with   Recurrent UTI   HPI: Zoe [name] is a 68 y.o. woman who presents today for possible recurrent UTI's.  Previous records reviewed.   Having over a month of symptoms of feels like something is pulling when she voids, cloudy urine in the urine smelling like rotten eggs.  She originally presented to her primary care provider with the symptoms and was placed on Cipro.  Her symptoms abated, but then the cloudy urine and the urine smelling or rotten eggs returned.  Her urinalysis at her PCP office was yellow cloudy, specific gravity 1.015, pH 6.0, 1+ protein, 2+ heme, nitrate positive, 3+ leukocytes, greater than 182 WBC's and 29 RBCs.   Urine culture was positive for Citrobacter koseri which was sensitive to the Cipro, but according to the notes in epic she was prescribed Macrobid.  It was sensitive to the Macrobid.  It appears that the 5 days of Cipro 500 mg were sent in Jul 17, 2023.  Patient denies any modifying or aggravating factors.  Patient denies any recent UTI's, gross hematuria, dysuria or suprapubic/flank pain.  Patient denies any fevers, chills, nausea or vomiting.    Today's UA yellow clear, specific gravity 1.020, pH 6.0, trace heme, 1+ leukocyte, greater than 30 WBCs, 3-10 RBCs, 0-10 epithelial cells and many bacteria.  PVR 0 mL    PMH: Past Medical History:  Diagnosis Date   Cataracts, bilateral    Depression    Diverticulosis    Extrusion of scleral buckle 11/15/2019   Exposed stitch in the past superonasal quadrant, resected   Glaucoma     Heart palpitations    History of kidney stones    Hypertension    Kidney stone    Lamellar macular hole of right eye    Primary open angle glaucoma of both eyes, moderate stage    Retinal detachment, right    Syncope    Hx; of one episode   Umbilical hernia    at birth    Surgical History: Past Surgical History:  Procedure Laterality Date   CATARACT EXTRACTION W/ INTRAOCULAR LENS IMPLANT Left    CATARACT EXTRACTION W/PHACO Right 04/13/2013   Procedure: RIGHT CATARACT EXTRACTION PHACO AND INTRAOCULAR LENS PLACEMENT (IOC);  Surgeon: Ben Bracken, MD;  Location: [facility] OR;  Service: Ophthalmology;  Laterality: Right;   COLONOSCOPY  01/06/2020   COLONOSCOPY     2010, 2016   CRYOABLATION     of cervix   CYSTOSCOPY W/ RETROGRADES  02/11/2022   Procedure: CYSTOSCOPY WITH RETROGRADE PYELOGRAM;  Surgeon: Geraline Knapp, MD;  Location: [facility] ORS;  Service: Urology;;   CYSTOSCOPY/URETEROSCOPY/HOLMIUM LASER/STENT PLACEMENT Right 02/11/2022   Procedure: CYSTOSCOPY/URETEROSCOPY/HOLMIUM LASER/STENT PLACEMENT;  Surgeon: Geraline Knapp, MD;  Location: [facility] ORS;  Service: Urology;  Laterality: Right;   CYSTOSCOPY/URETEROSCOPY/HOLMIUM LASER/STENT PLACEMENT Left 11/11/2022   Procedure: CYSTOSCOPY/URETEROSCOPY/HOLMIUM LASER/STENT PLACEMENT;  Surgeon: Geraline Knapp, MD;  Location: [facility] ORS;  Service: Urology;  Laterality: Left;   DILATION AND CURETTAGE OF UTERUS     ESOPHAGOGASTRODUODENOSCOPY  2010   KNEE ARTHROSCOPY     Hx:  of right knee   RETINAL DETACHMENT SURGERY Right 2015   x2   SHOULDER ARTHROSCOPY W/ ROTATOR CUFF REPAIR Left 2010   TUBAL LIGATION  2012    Home Medications:  Allergies as of 07/28/2023   No Known Allergies      Medication List        Accurate as of Jul 28, 2023  9:44 AM. If you have any questions, ask your nurse or doctor.          acetaminophen  500 MG tablet Commonly known as: TYLENOL  Take 500 mg by mouth every 6 (six) hours as needed for mild pain.    amLODipine 5 MG tablet Commonly known as: NORVASC Take 5 mg by mouth daily.   brimonidine-timolol 0.2-0.5 % ophthalmic solution Commonly known as: COMBIGAN Place 1 drop into both eyes every 12 (twelve) hours.   dorzolamide 2 % ophthalmic solution Commonly known as: TRUSOPT Place 1 drop into both eyes 3 (three) times daily.   ergocalciferol 1.25 MG (50000 UT) capsule Commonly known as: VITAMIN D2 Take 50,000 Units by mouth once a week. Sunday   latanoprost 0.005 % ophthalmic solution Commonly known as: XALATAN 1 drop at bedtime.   losartan 100 MG tablet Commonly known as: COZAAR Take 100 mg by mouth daily.        Allergies: No Known Allergies  Family History: Family History  Problem Relation Age of Onset   Hypertension Mother    Diabetes Mother    Hypertension Father    Glaucoma Father    Cancer Other    Breast cancer Paternal Aunt    Breast cancer Paternal Aunt     Social History:  reports that she has never smoked. She has never used smokeless tobacco. She reports that she does not currently use alcohol. She reports that she does not use drugs.  ROS: Pertinent ROS in HPI  Physical Exam: BP (!) 157/70   Pulse 69   Ht 5\' 4"  (1.626 m)   Wt 146 lb (66.2 kg)   BMI 25.06 kg/m   Constitutional:  Well nourished. Alert and oriented, No acute distress. HEENT: [place] AT, moist mucus membranes.  Trachea midline Cardiovascular: No clubbing, cyanosis, or edema. Respiratory: Normal respiratory effort, no increased work of breathing. Neurologic: Grossly intact, no focal deficits, moving all 4 extremities. Psychiatric: Normal mood and affect.    Laboratory Data: Urinalysis See EPIC and HPI  I have reviewed the labs.   Pertinent Imaging:  07/28/23 09:24  Scan Result 0ml    Assessment & Plan:    1. Suspected UTI (Primary) - Urinalysis, Complete - urine culture pending, I will wait till urine culture results are available before prescribing antibiotics as she  has been recently been on Macrobid and Cipro and would like to decrease the risk of developing antibiotic resistance - BLADDER SCAN AMB NON-IMAGING, demonstrates adequate bladder emptying  2.  Nephrolithiasis - Will obtain CT renal stone study to assess stone burden  3. Microscopic hematuria - UA with micro heme, but likely infected - Urine culture and CT renal stone are pending for further evaluation - Will continue to monitor, if microscopic hematuria persists will need to consider cystoscopy   Return in about 3 weeks (around 08/18/2023) for CT report and repeat UA .  These notes generated with voice recognition software. I apologize for typographical errors.  Briant Camper  [facility] [address]  [address] [place], [place] [address] [phone]

## 2023-07-30 LAB — CULTURE, URINE COMPREHENSIVE

## 2023-07-31 ENCOUNTER — Ambulatory Visit: Payer: Self-pay | Admitting: Urology

## 2023-07-31 DIAGNOSIS — R3989 Other symptoms and signs involving the genitourinary system: Secondary | ICD-10-CM

## 2023-07-31 MED ORDER — CEFDINIR 300 MG PO CAPS
300.0000 mg | ORAL_CAPSULE | Freq: Two times a day (BID) | ORAL | 0 refills | Status: DC
Start: 2023-07-31 — End: 2023-09-07

## 2023-08-07 ENCOUNTER — Ambulatory Visit

## 2023-08-13 ENCOUNTER — Ambulatory Visit: Payer: BC Managed Care – PPO | Admitting: Dermatology

## 2023-08-21 ENCOUNTER — Ambulatory Visit

## 2023-08-26 ENCOUNTER — Ambulatory Visit
Admission: RE | Admit: 2023-08-26 | Discharge: 2023-08-26 | Disposition: A | Discharge status: Home / Self Care | Source: Ambulatory Visit | Attending: Urology | Admitting: Urology

## 2023-08-26 DIAGNOSIS — R3129 Other microscopic hematuria: Secondary | ICD-10-CM | POA: Diagnosis present

## 2023-08-26 DIAGNOSIS — N200 Calculus of kidney: Secondary | ICD-10-CM | POA: Diagnosis present

## 2023-09-01 ENCOUNTER — Ambulatory Visit: Admitting: Urology

## 2023-09-02 ENCOUNTER — Ambulatory Visit (INDEPENDENT_AMBULATORY_CARE_PROVIDER_SITE_OTHER): Admitting: Urology

## 2023-09-02 ENCOUNTER — Telehealth: Payer: Self-pay

## 2023-09-02 VITALS — BP 135/63 | HR 96 | Ht 64.0 in | Wt 149.0 lb

## 2023-09-02 DIAGNOSIS — R350 Frequency of micturition: Secondary | ICD-10-CM

## 2023-09-02 DIAGNOSIS — N200 Calculus of kidney: Secondary | ICD-10-CM

## 2023-09-02 DIAGNOSIS — N131 Hydronephrosis with ureteral stricture, not elsewhere classified: Secondary | ICD-10-CM

## 2023-09-02 NOTE — Telephone Encounter (Signed)
 [facility] radiology called the triage line wanting to confirm that we got the pt's CT scan, and they wanted to let us  know that the pt has severe hydronephrosis on the left

## 2023-09-07 ENCOUNTER — Encounter: Payer: Self-pay | Admitting: Urology

## 2023-09-07 NOTE — Progress Notes (Signed)
 09/02/2023 4:59 PM   Zoe [name] 10/28/1955 [id number]  Referring provider: Marikay Eva POUR, PA [address] [facility] [place],  [place] [address]  Urological history: 1.  Nephrolithiasis - Left URS (2015) - Right URS (2023) - Left URS (2024)   2.  High risk hematuria - Non-smoker - Noncontrast CT (2023) -nephrolithiasis - cysto (2023) - NED  Chief Complaint  Patient presents with   Results   HPI: Zoe [name] is a 68 y.o. woman who presents today for CT scan results.    Previous records reviewed.   I saw her on Jul 28, 2023 for symptoms of simply feeling like it is pulling her when she voids, cloudy urine in her urine smelling like rotten eggs.  She had a positive urine culture for Citrobacter koseri and it was treated with culture appropriate antibiotics.    When she presented to me, she was still having her symptoms.  Her urine was suspicious for infection with greater than 30 WBCs, 3-10 RBCs and many bacteria.  Her urine culture was positive for Citrobacter koseri.   I started her on Omnicef .  I also obtained a CT renal stone study to assess her current stone burden to see if that was contributing to this persistent UTI.    CT renal stone study completed on August 26, 2023, noted moderate to severe left-sided hydronephrosis with abrupt narrowing at the left UPJ worrisome for stricturing.  There also been loss of overlying renal cortical parenchyma in the interim when compared to her last CT scan on September 22, 2022.  Today, her main complaint is urinary frequency.  She states the other symptoms of malodorous urine or cloudy urine has resolved.  Patient denies any modifying or aggravating factors.  Patient denies any recent UTI's, gross hematuria, dysuria or suprapubic/flank pain.  Patient denies any fevers, chills, nausea or vomiting.    PMH: Past Medical History:  Diagnosis Date   Cataracts, bilateral    Depression    Diverticulosis     Extrusion of scleral buckle 11/15/2019   Exposed stitch in the past superonasal quadrant, resected   Glaucoma    Heart palpitations    History of kidney stones    Hypertension    Kidney stone    Lamellar macular hole of right eye    Primary open angle glaucoma of both eyes, moderate stage    Retinal detachment, right    Syncope    Hx; of one episode   Umbilical hernia    at birth    Surgical History: Past Surgical History:  Procedure Laterality Date   CATARACT EXTRACTION W/ INTRAOCULAR LENS IMPLANT Left    CATARACT EXTRACTION W/PHACO Right 04/13/2013   Procedure: RIGHT CATARACT EXTRACTION PHACO AND INTRAOCULAR LENS PLACEMENT (IOC);  Surgeon: Gaither Quan, MD;  Location: [facility] OR;  Service: Ophthalmology;  Laterality: Right;   COLONOSCOPY  01/06/2020   COLONOSCOPY     2010, 2016   CRYOABLATION     of cervix   CYSTOSCOPY W/ RETROGRADES  02/11/2022   Procedure: CYSTOSCOPY WITH RETROGRADE PYELOGRAM;  Surgeon: Twylla Glendia BROCKS, MD;  Location: [facility] ORS;  Service: Urology;;   CYSTOSCOPY/URETEROSCOPY/HOLMIUM LASER/STENT PLACEMENT Right 02/11/2022   Procedure: CYSTOSCOPY/URETEROSCOPY/HOLMIUM LASER/STENT PLACEMENT;  Surgeon: Twylla Glendia BROCKS, MD;  Location: [facility] ORS;  Service: Urology;  Laterality: Right;   CYSTOSCOPY/URETEROSCOPY/HOLMIUM LASER/STENT PLACEMENT Left 11/11/2022   Procedure: CYSTOSCOPY/URETEROSCOPY/HOLMIUM LASER/STENT PLACEMENT;  Surgeon: Twylla Glendia BROCKS, MD;  Location: [facility] ORS;  Service: Urology;  Laterality:  Left;   DILATION AND CURETTAGE OF UTERUS     ESOPHAGOGASTRODUODENOSCOPY  2010   KNEE ARTHROSCOPY     Hx: of right knee   RETINAL DETACHMENT SURGERY Right 2015   x2   SHOULDER ARTHROSCOPY W/ ROTATOR CUFF REPAIR Left 2010   TUBAL LIGATION  2012    Home Medications:  Allergies as of 09/02/2023   No Known Allergies      Medication List        Accurate as of September 02, 2023 11:59 PM. If you have any questions, ask your nurse or doctor.          acetaminophen   500 MG tablet Commonly known as: TYLENOL  Take 500 mg by mouth every 6 (six) hours as needed for mild pain.   amLODipine 5 MG tablet Commonly known as: NORVASC Take 5 mg by mouth daily.   brimonidine-timolol 0.2-0.5 % ophthalmic solution Commonly known as: COMBIGAN Place 1 drop into both eyes every 12 (twelve) hours.   cefdinir  300 MG capsule Commonly known as: OMNICEF  Take 1 capsule (300 mg total) by mouth 2 (two) times daily.   dorzolamide 2 % ophthalmic solution Commonly known as: TRUSOPT Place 1 drop into both eyes 3 (three) times daily.   ergocalciferol 1.25 MG (50000 UT) capsule Commonly known as: VITAMIN D2 Take 50,000 Units by mouth once a week. Sunday   latanoprost 0.005 % ophthalmic solution Commonly known as: XALATAN 1 drop at bedtime.   losartan 100 MG tablet Commonly known as: COZAAR Take 100 mg by mouth daily.        Allergies: No Known Allergies  Family History: Family History  Problem Relation Age of Onset   Hypertension Mother    Diabetes Mother    Hypertension Father    Glaucoma Father    Cancer Other    Breast cancer Paternal Aunt    Breast cancer Paternal Aunt     Social History:  reports that she has never smoked. She has never used smokeless tobacco. She reports that she does not currently use alcohol. She reports that she does not use drugs.  ROS: Pertinent ROS in HPI  Physical Exam: BP 135/63   Pulse 96   Ht 5' 4 (1.626 m)   Wt 149 lb (67.6 kg)   BMI 25.58 kg/m   Constitutional:  Well nourished. Alert and oriented, No acute distress. HEENT:  AT, moist mucus membranes.  Trachea midline Cardiovascular: No clubbing, cyanosis, or edema. Respiratory: Normal respiratory effort, no increased work of breathing. Neurologic: Grossly intact, no focal deficits, moving all 4 extremities. Psychiatric: Normal mood and affect.    Laboratory Data: N/A  Pertinent Imaging: CLINICAL DATA:  Urolithiasis, symptomatic, cloudy,  foul-smelling urine.   EXAM: CT ABDOMEN AND PELVIS WITHOUT CONTRAST   TECHNIQUE: Multidetector CT imaging of the abdomen and pelvis was performed following the standard protocol without IV contrast.   RADIATION DOSE REDUCTION: This exam was performed according to the departmental dose-optimization program which includes automated exposure control, adjustment of the mA and/or kV according to patient size and/or use of iterative reconstruction technique.   COMPARISON:  Jul 02, 2023, September 18, 2022   FINDINGS: Of note, the lack of intravenous contrast limits evaluation of the solid organ parenchyma and vascularity.   Lower chest:  Posterior bibasilar dependent atelectasis.   Hepatobiliary: No mass.Decompressed gallbladder without radiopaque stones or wall thickening. No intrahepatic or extrahepatic biliary ductal dilation.   Pancreas: No mass or main ductal dilation. No peripancreatic inflammation or fluid  collection.   Spleen: Normal size. No mass.   Adrenals/Urinary Tract: No adrenal masses. Unchanged 3.3 cm right lower pole renal cyst. A second 1.3 cm right lower pole cyst is also present. A couple of punctate renal calculi are present on the right without hydronephrosis. Moderate to severe hydronephrosis on the left with interval renal cortical atrophy. Abrupt narrowing of the hydronephrosis at the UPJ. Overall, decreased calyceal stone burden in the left lower pole, with only a small residual staghorn type calculus measuring 1.2 cm in craniocaudal dimension. Layering punctate calculi versus curvilinear calcification also noted in the lower pole calices. The urinary bladder is distended without focal abnormality.   Stomach/Bowel: The stomach is decompressed without focal abnormality. No small bowel wall thickening or inflammation. No small bowel obstruction.Normal appendix. Scattered total colonic diverticulosis. No changes of acute diverticulitis.    Vascular/Lymphatic: No aortic aneurysm. Diffuse aortoiliac atherosclerosis. No intraabdominal or pelvic lymphadenopathy.   Reproductive: Age-related atrophy of the uterus and ovaries. No concerning adnexal mass. Multiple small involuted fibroids noted. No free pelvic fluid.   Other: No pneumoperitoneum, ascites, or mesenteric inflammation.   Musculoskeletal: No acute fracture or destructive lesion. Multilevel thoracic osteophytosis. Lumbar facet arthropathy.   IMPRESSION: 1. Moderate to severe left-sided hydronephrosis with abrupt narrowing at the left UPJ, worrisome for stricturing. Of note, there has been loss of overlying renal cortical parenchyma in the interim. No obstructive ureteral calculi present. 2. Significant interval decrease in stone burden in the lower pole of the left kidney with a small residual staghorn type calculus measuring 1.3 cm. Curvilinear calcification layering in the lower pole calices also present. 3. Scattered total colonic diverticulosis. No changes of acute diverticulitis.   Aortic Atherosclerosis (ICD10-I70.0).   These results will be called to the ordering clinician or representative by the Radiologist Assistant and communication documented in the PACS or [employer].     Electronically Signed   By: Rogelia Myers M.D.   On: 09/01/2023 23:06  I have independently reviewed the films.  See HPI.    Assessment & Plan:    1. Left UPJ obstruction - Explained the CT scan findings to the patient - Will schedule renal Lasix scan for further evaluation of left renal function  2.  Nephrolithiasis - Significant interval decrease in stone burden in the lower pole of the left kidney with a small residual staghorn type calculus and curvilinear calcification layering in the lower pole calyces also present - Will continue to monitor  3.  Urinary frequency - She contributes this to drinking a lot of fluids - Will continue to monitor  Return for  return for renal lasix scan .  These notes generated with voice recognition software. I apologize for typographical errors.  CLOTILDA HELON RIGGERS  [facility] [address]  [address] [place], [place] [address] [phone]

## 2023-09-18 ENCOUNTER — Ambulatory Visit
Admission: RE | Admit: 2023-09-18 | Discharge: 2023-09-18 | Disposition: A | Discharge status: Home / Self Care | Source: Ambulatory Visit | Attending: Urology | Admitting: Urology

## 2023-09-18 DIAGNOSIS — N131 Hydronephrosis with ureteral stricture, not elsewhere classified: Secondary | ICD-10-CM | POA: Diagnosis present

## 2023-09-18 MED ORDER — TECHNETIUM TC 99M MERTIATIDE
5.0000 | Freq: Once | INTRAVENOUS | Status: AC | PRN
  Administered 2023-09-18: 5.2 via INTRAVENOUS

## 2023-09-18 MED ORDER — FUROSEMIDE 10 MG/ML IJ SOLN
34.0000 mg | INTRAMUSCULAR | Status: AC
  Administered 2023-09-18: 34 mg via INTRAVENOUS
  Filled 2023-09-18: qty 3.4

## 2023-09-22 NOTE — Progress Notes (Deleted)
 09/24/2023 12:35 PM   Zoe [name] 1955/03/18 [id number]  Referring provider: Marikay Eva POUR, PA [address] [facility] [place],  [place] [address]  Urological history: 1.  Nephrolithiasis - Left URS (2015) - Right URS (2023) - Left URS (2024)   2.  High risk hematuria - Non-smoker - Noncontrast CT (2023) -nephrolithiasis - cysto (2023) - NED  No chief complaint on file.  HPI: Zoe [name] is a 68 y.o. woman who presents today for Renal Lasix  results.   Previous records reviewed.   At her visit on 09/02/2023, I saw her on Jul 28, 2023 for symptoms of simply feeling like it is pulling her when she voids, cloudy urine in her urine smelling like rotten eggs.  She had a positive urine culture for Citrobacter koseri and it was treated with culture appropriate antibiotics.  When she presented to me, she was still having her symptoms.  Her urine was suspicious for infection with greater than 30 WBCs, 3-10 RBCs and many bacteria.  Her urine culture was positive for Citrobacter koseri.   I started her on Omnicef .  I also obtained a CT renal stone study to assess her current stone burden to see if that was contributing to this persistent UTI.  CT renal stone study completed on August 26, 2023, noted moderate to severe left-sided hydronephrosis with abrupt narrowing at the left UPJ worrisome for stricturing.  There also been loss of overlying renal cortical parenchyma in the interim when compared to her last CT scan on September 22, 2022.  Today, her main complaint is urinary frequency.  She states the other symptoms of malodorous urine or cloudy urine has resolved.  Patient denies any modifying or aggravating factors.  Patient denies any recent UTI's, gross hematuria, dysuria or suprapubic/flank pain.  Patient denies any fevers, chills, nausea or vomiting.    Renal Lasix  scan was performed on 09/18/2023 and noted split renal function of 14.5% in the left kidney and  85.5% in the right kidney.   T 1/2 left kidney 40.4 and 27.1 in right kidney.    PMH: Past Medical History:  Diagnosis Date   Cataracts, bilateral    Depression    Diverticulosis    Extrusion of scleral buckle 11/15/2019   Exposed stitch in the past superonasal quadrant, resected   Glaucoma    Heart palpitations    History of kidney stones    Hypertension    Kidney stone    Lamellar macular hole of right eye    Primary open angle glaucoma of both eyes, moderate stage    Retinal detachment, right    Syncope    Hx; of one episode   Umbilical hernia    at birth    Surgical History: Past Surgical History:  Procedure Laterality Date   CATARACT EXTRACTION W/ INTRAOCULAR LENS IMPLANT Left    CATARACT EXTRACTION W/PHACO Right 04/13/2013   Procedure: RIGHT CATARACT EXTRACTION PHACO AND INTRAOCULAR LENS PLACEMENT (IOC);  Surgeon: Gaither Quan, MD;  Location: [facility] OR;  Service: Ophthalmology;  Laterality: Right;   COLONOSCOPY  01/06/2020   COLONOSCOPY     2010, 2016   CRYOABLATION     of cervix   CYSTOSCOPY W/ RETROGRADES  02/11/2022   Procedure: CYSTOSCOPY WITH RETROGRADE PYELOGRAM;  Surgeon: Twylla Glendia BROCKS, MD;  Location: [facility] ORS;  Service: Urology;;   CYSTOSCOPY/URETEROSCOPY/HOLMIUM LASER/STENT PLACEMENT Right 02/11/2022   Procedure: CYSTOSCOPY/URETEROSCOPY/HOLMIUM LASER/STENT PLACEMENT;  Surgeon: Twylla Glendia BROCKS, MD;  Location: [facility] ORS;  Service: Urology;  Laterality: Right;   CYSTOSCOPY/URETEROSCOPY/HOLMIUM LASER/STENT PLACEMENT Left 11/11/2022   Procedure: CYSTOSCOPY/URETEROSCOPY/HOLMIUM LASER/STENT PLACEMENT;  Surgeon: Twylla Glendia BROCKS, MD;  Location: [facility] ORS;  Service: Urology;  Laterality: Left;   DILATION AND CURETTAGE OF UTERUS     ESOPHAGOGASTRODUODENOSCOPY  2010   KNEE ARTHROSCOPY     Hx: of right knee   RETINAL DETACHMENT SURGERY Right 2015   x2   SHOULDER ARTHROSCOPY W/ ROTATOR CUFF REPAIR Left 2010   TUBAL LIGATION  2012    Home Medications:  Allergies as  of 09/24/2023   No Known Allergies      Medication List        Accurate as of September 22, 2023 12:35 PM. If you have any questions, ask your nurse or doctor.          acetaminophen  500 MG tablet Commonly known as: TYLENOL  Take 500 mg by mouth every 6 (six) hours as needed for mild pain.   amLODipine 5 MG tablet Commonly known as: NORVASC Take 5 mg by mouth daily.   brimonidine-timolol 0.2-0.5 % ophthalmic solution Commonly known as: COMBIGAN Place 1 drop into both eyes every 12 (twelve) hours.   dorzolamide 2 % ophthalmic solution Commonly known as: TRUSOPT Place 1 drop into both eyes 3 (three) times daily.   ergocalciferol 1.25 MG (50000 UT) capsule Commonly known as: VITAMIN D2 Take 50,000 Units by mouth once a week. Sunday   latanoprost 0.005 % ophthalmic solution Commonly known as: XALATAN 1 drop at bedtime.   losartan 100 MG tablet Commonly known as: COZAAR Take 100 mg by mouth daily.        Allergies: No Known Allergies  Family History: Family History  Problem Relation Age of Onset   Hypertension Mother    Diabetes Mother    Hypertension Father    Glaucoma Father    Cancer Other    Breast cancer Paternal Aunt    Breast cancer Paternal Aunt     Social History:  reports that she has never smoked. She has never used smokeless tobacco. She reports that she does not currently use alcohol. She reports that she does not use drugs.  ROS: Pertinent ROS in HPI  Physical Exam: There were no vitals taken for this visit.  Constitutional:  Well nourished. Alert and oriented, No acute distress. HEENT: [place] AT, moist mucus membranes.  Trachea midline, no masses. Cardiovascular: No clubbing, cyanosis, or edema. Respiratory: Normal respiratory effort, no increased work of breathing. GU: No CVA tenderness.  No bladder fullness or masses.  Recession of labia minora, dry, pale vulvar vaginal mucosa and loss of mucosal ridges and folds.  Normal urethral meatus, no  lesions, no prolapse, no discharge.   No urethral masses, tenderness and/or tenderness. No bladder fullness, tenderness or masses. *** vagina mucosa, *** estrogen effect, no discharge, no lesions, *** pelvic support, *** cystocele and *** rectocele noted.  No cervical motion tenderness.  Uterus is freely mobile and non-fixed.  No adnexal/parametria masses or tenderness noted.  Anus and perineum are without rashes or lesions.   ***  Neurologic: Grossly intact, no focal deficits, moving all 4 extremities. Psychiatric: Normal mood and affect.    Laboratory Data: See EPIC and HPI I have reviewed the labs.  See HPI.     Pertinent Imaging: *** I have independently reviewed the films.  See HPI.    Assessment & Plan:    1. Left UPJ obstruction - Renal Lasix  scan shows ***  2.  Nephrolithiasis -  Significant interval decrease in stone burden in the lower pole of the left kidney with a small residual staghorn type calculus and curvilinear calcification layering in the lower pole calyces also present - Will continue to monitor  3.  Urinary frequency - She contributes this to drinking a lot of fluids - Will continue to monitor  No follow-ups on file.  These notes generated with voice recognition software. I apologize for typographical errors.  Zoe [name]  [facility] [address]  [address] [place], [place] [address] [phone]

## 2023-09-23 ENCOUNTER — Ambulatory Visit: Admitting: Dermatology

## 2023-09-23 DIAGNOSIS — L84 Corns and callosities: Secondary | ICD-10-CM | POA: Diagnosis not present

## 2023-09-23 DIAGNOSIS — D229 Melanocytic nevi, unspecified: Secondary | ICD-10-CM

## 2023-09-23 DIAGNOSIS — Z1283 Encounter for screening for malignant neoplasm of skin: Secondary | ICD-10-CM | POA: Diagnosis not present

## 2023-09-23 DIAGNOSIS — L821 Other seborrheic keratosis: Secondary | ICD-10-CM

## 2023-09-23 DIAGNOSIS — L820 Inflamed seborrheic keratosis: Secondary | ICD-10-CM

## 2023-09-23 NOTE — Progress Notes (Signed)
   Follow-Up Visit   Subjective  Zoe [name] is a 68 y.o. female who presents for the following: Skin Cancer Screening and Full Body Skin Exam Hx of isks and sks. Reports a spot at right temple she states is growing for over a month.   The patient presents for Total-Body Skin Exam (TBSE) for skin cancer screening and mole check. The patient has spots, moles and lesions to be evaluated, some may be new or changing and the patient may have concern these could be cancer.    The following portions of the chart were reviewed this encounter and updated as appropriate: medications, allergies, medical history  Review of Systems:  No other skin or systemic complaints except as noted in HPI or Assessment and Plan.  Objective  Well appearing patient in no apparent distress; mood and affect are within normal limits.  A full examination was performed including scalp, head, eyes, ears, nose, lips, neck, chest, axillae, abdomen, back, buttocks, bilateral upper extremities, bilateral lower extremities, hands, feet, fingers, toes, fingernails, and toenails. All findings within normal limits unless otherwise noted below.   Relevant physical exam findings are noted in the Assessment and Plan.  Right Temple x 1 Erythematous stuck-on, waxy papule or plaque  Assessment & Plan   SKIN CANCER SCREENING PERFORMED TODAY.  SEBORRHEIC KERATOSES, - Benign normal skin lesions - Benign-appearing - Call for any changes Sks at back and inframammary  Sk at right temple - discussed ln2 or cut off  Both treatments could cause light colored spot on skin Recheck in 2 to 3 months   MELANOCYTIC NEVI - Tan-brown and/or pink-flesh-colored symmetric macules and papules - Benign appearing on exam today - Observation - Call clinic for new or changing moles - Recommend daily use of broad spectrum spf 30+ sunscreen to sun-exposed areas.   Calluses at b/l toes Exam: thickened hardened skin at b/l toes   Treatment  Plan: Benign. Observe No recommended treatment    INFLAMED SEBORRHEIC KERATOSIS Right Temple x 1 Symptomatic, irritating, patient would like treated.  Discussed could leave a light spot with treatment.   Observed isk at left temple , patient declined treatment today, may treat with light treatment of Ln2 at next visit Destruction of lesion - Right Temple x 1 Complexity: simple   Destruction method: cryotherapy   Informed consent: discussed and consent obtained   Timeout:  patient name, date of birth, surgical site, and procedure verified Lesion destroyed using liquid nitrogen: Yes   Region frozen until ice ball extended beyond lesion: Yes   Outcome: patient tolerated procedure well with no complications   Post-procedure details: wound care instructions given    Return for 2 - 3 month isk follow up , 1 year tbse .  IEleanor [name], CMA, am acting as scribe for Alm Rhyme, MD.   Documentation: I have reviewed the above documentation for accuracy and completeness, and I agree with the above.  Alm Rhyme, MD

## 2023-09-23 NOTE — Patient Instructions (Addendum)

## 2023-09-24 ENCOUNTER — Other Ambulatory Visit: Payer: Self-pay | Admitting: *Deleted

## 2023-09-24 ENCOUNTER — Ambulatory Visit: Admitting: Urology

## 2023-09-24 DIAGNOSIS — N131 Hydronephrosis with ureteral stricture, not elsewhere classified: Secondary | ICD-10-CM

## 2023-09-25 ENCOUNTER — Other Ambulatory Visit: Payer: Self-pay

## 2023-09-25 ENCOUNTER — Telehealth: Payer: Self-pay

## 2023-09-25 ENCOUNTER — Ambulatory Visit: Payer: Self-pay | Admitting: Urology

## 2023-09-25 ENCOUNTER — Telehealth: Payer: Self-pay | Admitting: Urology

## 2023-09-25 DIAGNOSIS — N1330 Unspecified hydronephrosis: Secondary | ICD-10-CM

## 2023-09-25 DIAGNOSIS — N131 Hydronephrosis with ureteral stricture, not elsewhere classified: Secondary | ICD-10-CM

## 2023-09-25 LAB — BASIC METABOLIC PANEL WITH GFR
BUN/Creatinine Ratio: 15 (ref 12–28)
BUN: 18 mg/dL (ref 8–27)
CO2: 22 mmol/L (ref 20–29)
Calcium: 9.3 mg/dL (ref 8.7–10.3)
Chloride: 105 mmol/L (ref 96–106)
Creatinine, Ser: 1.22 mg/dL — ABNORMAL HIGH (ref 0.57–1.00)
Glucose: 85 mg/dL (ref 70–99)
Potassium: 4.6 mmol/L (ref 3.5–5.2)
Sodium: 141 mmol/L (ref 134–144)
eGFR: 49 mL/min/1.73 — ABNORMAL LOW (ref >59)

## 2023-09-25 NOTE — Telephone Encounter (Signed)
 Per Dr. Twylla, Patient is to be scheduled for Left Cystoscopy with Stent Placement   Zoe [name] was contacted and possible surgical dates were discussed, Thursday August 7th, 2025 was agreed upon for surgery.   Patient was instructed that Dr. Twylla will require them to provide a pre-op UA & CX prior to surgery. This was ordered and scheduled drop off appointment was made for 09/28/2023.    Patient was directed to call [phone] between 1-3pm the day before surgery to find out surgical arrival time.  Instructions were given not to eat or drink from midnight on the night before surgery and have a driver for the day of surgery. On the surgery day patient was instructed to enter through the Medical Mall entrance of [facility] report the Same Day Surgery desk.   Pre-Admit Testing will be in contact via phone to set up an interview with the anesthesia team to review your history and medications prior to surgery.   Reminder of this information was sent via MyChart to the patient.

## 2023-09-25 NOTE — Progress Notes (Signed)
   [facility] Surgical Posting Form  Surgery Date: Date: 10/08/2023  Surgeon: Dr. Glendia Barba, MD  Inpt ( No  )   Outpt (Yes)   Obs ( No  )   Diagnosis: N13.30 Left Hydronephrosis  -CPT: 47667  Surgery: Left Cystoscopy with Stent Placement  Stop Anticoagulations: Yes, will need to hold ASA for 5 days  Cardiac/Medical/Pulmonary Clearance needed: no  *Orders entered into EPIC  Date: 09/25/23   *Case booked in [place]  Date: 09/25/23  *Notified pt of Surgery: Date: 09/25/23  PRE-OP UA & CX: yes, will obtain in clinic on 09/28/2023  *Placed into Prior Authorization Work [place] Date: 09/25/23  Assistant/laser/rep:No

## 2023-09-25 NOTE — Telephone Encounter (Signed)
 Surgical Physician Order Form [facility]  * Scheduling expectation : Next Available w/ Dr. Twylla   *Length of Case:   *Clearance needed: no  *Anticoagulation Instructions: N/A  *Aspirin Instructions: Stop ASA  *Post-op visit Date/Instructions:   TBD  *Diagnosis: Left Hydronephrosis  *Procedure: left Cysto w/stent placement (47667)   Additional orders: N/A  -Admit type: OUTpatient  -Anesthesia: General  -VTE Prophylaxis Standing Order SCD's       Other:   -Standing Lab Orders Per Anesthesia    Lab other: UA&Urine Culture  -Standing Test orders EKG/Chest x-ray per Anesthesia       Test other:   - Medications:  Ancef  2gm IV  -Other orders:  N/A

## 2023-09-25 NOTE — Progress Notes (Signed)
 Surgical Physician Order Form [facility]  * Scheduling expectation : Next Available w/ Dr. Twylla   *Length of Case:   *Clearance needed: no  *Anticoagulation Instructions: N/A  *Aspirin Instructions: Stop ASA  *Post-op visit Date/Instructions:   TBD  *Diagnosis: Left Hydronephrosis  *Procedure: left Cysto w/stent placement (47667)   Additional orders: N/A  -Admit type: OUTpatient  -Anesthesia: General  -VTE Prophylaxis Standing Order SCD's       Other:   -Standing Lab Orders Per Anesthesia    Lab other: UA&Urine Culture  -Standing Test orders EKG/Chest x-ray per Anesthesia       Test other:   - Medications:  Ancef  2gm IV  -Other orders:  N/A

## 2023-09-28 ENCOUNTER — Encounter: Payer: Self-pay | Admitting: Urology

## 2023-09-28 ENCOUNTER — Other Ambulatory Visit

## 2023-09-29 ENCOUNTER — Encounter: Payer: Self-pay | Admitting: Dermatology

## 2023-10-01 ENCOUNTER — Other Ambulatory Visit

## 2023-10-01 DIAGNOSIS — N1330 Unspecified hydronephrosis: Secondary | ICD-10-CM

## 2023-10-01 LAB — URINALYSIS, COMPLETE
Bilirubin, UA: NEGATIVE
Glucose, UA: NEGATIVE
Ketones, UA: NEGATIVE
Nitrite, UA: NEGATIVE
Protein,UA: NEGATIVE
Specific Gravity, UA: 1.025 (ref 1.005–1.030)
Urobilinogen, Ur: 0.2 mg/dL (ref 0.2–1.0)
pH, UA: 6 (ref 5.0–7.5)

## 2023-10-01 LAB — MICROSCOPIC EXAMINATION: RBC, Urine: >30 /HPF — AB (ref 0–2)

## 2023-10-02 ENCOUNTER — Encounter
Admission: RE | Admit: 2023-10-02 | Discharge: 2023-10-02 | Disposition: A | Discharge status: Home / Self Care | Source: Ambulatory Visit | Attending: Urology | Admitting: Urology

## 2023-10-02 ENCOUNTER — Other Ambulatory Visit: Payer: Self-pay

## 2023-10-02 VITALS — Ht 64.0 in | Wt 150.0 lb

## 2023-10-02 DIAGNOSIS — Z01810 Encounter for preprocedural cardiovascular examination: Secondary | ICD-10-CM

## 2023-10-02 DIAGNOSIS — N289 Disorder of kidney and ureter, unspecified: Secondary | ICD-10-CM | POA: Diagnosis not present

## 2023-10-02 DIAGNOSIS — I10 Essential (primary) hypertension: Secondary | ICD-10-CM

## 2023-10-02 DIAGNOSIS — Z01818 Encounter for other preprocedural examination: Secondary | ICD-10-CM

## 2023-10-02 DIAGNOSIS — Z01812 Encounter for preprocedural laboratory examination: Secondary | ICD-10-CM

## 2023-10-02 LAB — CBC
HCT: 38.2 % (ref 36.0–46.0)
Hemoglobin: 12.4 g/dL (ref 12.0–15.0)
MCH: 30.3 pg (ref 26.0–34.0)
MCHC: 32.5 g/dL (ref 30.0–36.0)
MCV: 93.4 fL (ref 80.0–100.0)
Platelets: 257 K/uL (ref 150–400)
RBC: 4.09 MIL/uL (ref 3.87–5.11)
RDW: 12.8 % (ref 11.5–15.5)
WBC: 5.9 K/uL (ref 4.0–10.5)
nRBC: 0 % (ref 0.0–0.2)

## 2023-10-02 NOTE — Patient Instructions (Addendum)
 Your procedure is scheduled on:  THURSDAY AUGUST 7  Report to the Registration Desk on the 1st floor of the [facility] To find out your arrival time, please call [phone] between 1PM - 3PM on:   [facility] AUGUST 6  If your arrival time is 6:00 am, do not arrive before that time as the Medical Mall entrance doors do not open until 6:00 am.  REMEMBER: Instructions that are not followed completely may result in serious medical risk, up to and including death; or upon the discretion of your surgeon and anesthesiologist your surgery may need to be rescheduled.  Do not eat food after midnight the night before surgery.  No gum chewing or hard candies.  One week prior to surgery:THURSDAY JULY 31  Stop Anti-inflammatories (NSAIDS) such as Advil, Aleve, Ibuprofen, Motrin, Naproxen, Naprosyn and Aspirin based products such as Excedrin, Goody's Powder, BC Powder. Stop ANY OVER THE COUNTER supplements until after surgery. ergocalciferol (VITAMIN D2)  Neuriva   You may however, continue to take Tylenol  if needed for pain up until the day of surgery.  Continue taking all of your other prescription medications up until the day of surgery.  ON THE DAY OF SURGERY DO NOT TAKE ANY MEDICATIONS   No Alcohol for 24 hours before or after surgery.  Do not use any recreational drugs for at least a week (preferably 2 weeks) before your surgery.  Please be advised that the combination of cocaine and anesthesia may have negative outcomes, up to and including death. If you test positive for cocaine, your surgery will be cancelled.  On the morning of surgery brush your teeth with toothpaste and water , you may rinse your mouth with mouthwash if you wish. Do not swallow any toothpaste or mouthwash.  Do not wear jewelry, make-up, hairpins, clips or nail polish.  For welded (permanent) jewelry: bracelets, anklets, waist bands, etc.  Please have this removed prior to surgery.  If it is not removed, there is  a chance that hospital personnel will need to cut it off on the day of surgery.  Do not wear lotions, powders, or perfumes.   Do not shave body hair from the neck down 48 hours before surgery.  Do not bring valuables to the hospital. [facility] is not responsible for any missing/lost belongings or valuables.   Notify your doctor if there is any change in your medical condition (cold, fever, infection).  Wear comfortable clothing (specific to your surgery type) to the hospital.  After surgery, you can help prevent lung complications by doing breathing exercises.  Take deep breaths and cough every 1-2 hours.   If you are being discharged the day of surgery, you will not be allowed to drive home. You will need a responsible individual to drive you home and stay with you for 24 hours after surgery.   If you are taking public transportation, you will need to have a responsible individual with you.  Please call the Pre-admissions Testing Dept. at [phone] if you have any questions about these instructions.  Surgery Visitation Policy:  Patients having surgery or a procedure may have two visitors.  Children under the age of 57 must have an adult with them who is not the patient.  Merchandiser, retail to address health-related social needs:  [email or web address]

## 2023-10-05 ENCOUNTER — Inpatient Hospital Stay: Admission: RE | Admit: 2023-10-05 | Source: Ambulatory Visit

## 2023-10-06 LAB — CULTURE, URINE COMPREHENSIVE

## 2023-10-08 ENCOUNTER — Ambulatory Visit: Payer: Self-pay | Admitting: Urgent Care

## 2023-10-08 ENCOUNTER — Ambulatory Visit
Admission: RE | Admit: 2023-10-08 | Discharge: 2023-10-08 | Disposition: A | Discharge status: Home / Self Care | Attending: Urology | Admitting: Urology

## 2023-10-08 ENCOUNTER — Telehealth: Admitting: Urology

## 2023-10-08 ENCOUNTER — Other Ambulatory Visit: Payer: Self-pay

## 2023-10-08 ENCOUNTER — Encounter: Payer: Self-pay | Admitting: Urology

## 2023-10-08 ENCOUNTER — Ambulatory Visit

## 2023-10-08 ENCOUNTER — Encounter
Admission: RE | Disposition: A | Discharge status: Home / Self Care | Payer: Self-pay | Source: Home / Self Care | Attending: Urology

## 2023-10-08 DIAGNOSIS — Q6211 Congenital occlusion of ureteropelvic junction: Secondary | ICD-10-CM | POA: Diagnosis not present

## 2023-10-08 DIAGNOSIS — N131 Hydronephrosis with ureteral stricture, not elsewhere classified: Secondary | ICD-10-CM | POA: Diagnosis not present

## 2023-10-08 DIAGNOSIS — N1330 Unspecified hydronephrosis: Secondary | ICD-10-CM

## 2023-10-08 DIAGNOSIS — N261 Atrophy of kidney (terminal): Secondary | ICD-10-CM | POA: Insufficient documentation

## 2023-10-08 DIAGNOSIS — I10 Essential (primary) hypertension: Secondary | ICD-10-CM | POA: Insufficient documentation

## 2023-10-08 DIAGNOSIS — Z87440 Personal history of urinary (tract) infections: Secondary | ICD-10-CM | POA: Insufficient documentation

## 2023-10-08 DIAGNOSIS — Z87442 Personal history of urinary calculi: Secondary | ICD-10-CM | POA: Insufficient documentation

## 2023-10-08 SURGERY — CYSTOSCOPY, WITH RETROGRADE PYELOGRAM
Site: Ureter | Laterality: Left

## 2023-10-08 MED ORDER — LIDOCAINE HCL (CARDIAC) PF 100 MG/5ML IV SOSY
PREFILLED_SYRINGE | INTRAVENOUS | Status: DC | PRN
  Administered 2023-10-08: 60 mg via INTRAVENOUS

## 2023-10-08 MED ORDER — ONDANSETRON HCL 4 MG/2ML IJ SOLN
INTRAMUSCULAR | Status: AC
  Filled 2023-10-08: qty 2

## 2023-10-08 MED ORDER — ONDANSETRON HCL 4 MG/2ML IJ SOLN
INTRAMUSCULAR | Status: DC | PRN
  Administered 2023-10-08: 4 mg via INTRAVENOUS

## 2023-10-08 MED ORDER — MIDAZOLAM HCL 2 MG/2ML IJ SOLN
INTRAMUSCULAR | Status: AC
  Filled 2023-10-08: qty 2

## 2023-10-08 MED ORDER — MIDAZOLAM HCL 2 MG/2ML IJ SOLN
INTRAMUSCULAR | Status: DC | PRN
Start: 2023-10-08 — End: 2023-10-08
  Administered 2023-10-08: 2 mg via INTRAVENOUS

## 2023-10-08 MED ORDER — KETOROLAC TROMETHAMINE 30 MG/ML IJ SOLN
INTRAMUSCULAR | Status: DC | PRN
  Administered 2023-10-08: 30 mg via INTRAVENOUS

## 2023-10-08 MED ORDER — FENTANYL CITRATE (PF) 100 MCG/2ML IJ SOLN: 25.0000 ug | INTRAMUSCULAR | Status: DC | PRN

## 2023-10-08 MED ORDER — CEFAZOLIN SODIUM-DEXTROSE 2-4 GM/100ML-% IV SOLN
INTRAVENOUS | Status: AC
  Filled 2023-10-08: qty 100

## 2023-10-08 MED ORDER — PROPOFOL 10 MG/ML IV BOLUS
INTRAVENOUS | Status: AC
  Filled 2023-10-08: qty 20

## 2023-10-08 MED ORDER — KETOROLAC TROMETHAMINE 30 MG/ML IJ SOLN
INTRAMUSCULAR | Status: AC
  Filled 2023-10-08: qty 1

## 2023-10-08 MED ORDER — FENTANYL CITRATE (PF) 100 MCG/2ML IJ SOLN
INTRAMUSCULAR | Status: AC
  Filled 2023-10-08: qty 2

## 2023-10-08 MED ORDER — STERILE WATER FOR IRRIGATION IR SOLN
Status: DC | PRN
Start: 2023-10-08 — End: 2023-10-08
  Administered 2023-10-08: 500 mL

## 2023-10-08 MED ORDER — CHLORHEXIDINE GLUCONATE 0.12 % MT SOLN
15.0000 mL | Freq: Once | OROMUCOSAL | Status: AC
  Administered 2023-10-08: 15 mL via OROMUCOSAL

## 2023-10-08 MED ORDER — PROPOFOL 500 MG/50ML IV EMUL
INTRAVENOUS | Status: DC | PRN
  Administered 2023-10-08: 120 ug/kg/min via INTRAVENOUS

## 2023-10-08 MED ORDER — CHLORHEXIDINE GLUCONATE 0.12 % MT SOLN
OROMUCOSAL | Status: AC
  Filled 2023-10-08: qty 15

## 2023-10-08 MED ORDER — ORAL CARE MOUTH RINSE: 15.0000 mL | Freq: Once | OROMUCOSAL | Status: AC

## 2023-10-08 MED ORDER — PROPOFOL 10 MG/ML IV BOLUS
INTRAVENOUS | Status: DC | PRN
  Administered 2023-10-08: 100 mg via INTRAVENOUS

## 2023-10-08 MED ORDER — CEFAZOLIN SODIUM-DEXTROSE 2-4 GM/100ML-% IV SOLN
2.0000 g | INTRAVENOUS | Status: AC
  Administered 2023-10-08: 2 g via INTRAVENOUS

## 2023-10-08 MED ORDER — LACTATED RINGERS IV SOLN: INTRAVENOUS | Status: DC

## 2023-10-08 MED ORDER — OXYCODONE HCL 5 MG PO TABS: 5.0000 mg | ORAL_TABLET | Freq: Once | ORAL | Status: DC | PRN

## 2023-10-08 MED ORDER — OXYCODONE HCL 5 MG/5ML PO SOLN: 5.0000 mg | Freq: Once | ORAL | Status: DC | PRN

## 2023-10-08 MED ORDER — SODIUM CHLORIDE 0.9 % IR SOLN
Status: DC | PRN
  Administered 2023-10-08: 3000 mL

## 2023-10-08 MED ORDER — DEXAMETHASONE SODIUM PHOSPHATE 10 MG/ML IJ SOLN
INTRAMUSCULAR | Status: DC | PRN
  Administered 2023-10-08: 5 mg via INTRAVENOUS

## 2023-10-08 MED ORDER — DEXAMETHASONE SODIUM PHOSPHATE 10 MG/ML IJ SOLN
INTRAMUSCULAR | Status: AC
  Filled 2023-10-08: qty 1

## 2023-10-08 MED ORDER — IOHEXOL 180 MG/ML  SOLN
INTRAMUSCULAR | Status: DC | PRN
Start: 2023-10-08 — End: 2023-10-08
  Administered 2023-10-08: 10 mL

## 2023-10-08 SURGICAL SUPPLY — 16 items
BAG DRAIN SIEMENS DORNER NS (MISCELLANEOUS) ×2 IMPLANT
BRUSH SCRUB EZ 4% CHG (MISCELLANEOUS) ×2 IMPLANT
CATH URETL OPEN END 6X70 (CATHETERS) ×2 IMPLANT
GLOVE BIOGEL PI IND STRL 7.5 (GLOVE) ×2 IMPLANT
GOWN STRL REUS W/ TWL XL LVL3 (GOWN DISPOSABLE) ×2 IMPLANT
GUIDEWIRE STR DUAL SENSOR (WIRE) ×2 IMPLANT
GUIDEWIRE STR ZIPWIRE 035X150 (MISCELLANEOUS) ×1 IMPLANT
KIT TURNOVER CYSTO (KITS) ×2 IMPLANT
PACK CYSTO AR (MISCELLANEOUS) ×2 IMPLANT
SET CYSTO W/LG BORE CLAMP LF (SET/KITS/TRAYS/PACK) ×2 IMPLANT
SOL .9 NS 3000ML IRR UROMATIC (IV SOLUTION) ×2 IMPLANT
STENT URET 6FRX24 CONTOUR (STENTS) IMPLANT
STENT URET 6FRX26 CONTOUR (STENTS) IMPLANT
SURGILUBE 2OZ TUBE FLIPTOP (MISCELLANEOUS) ×2 IMPLANT
WATER STERILE IRR 1000ML POUR (IV SOLUTION) ×2 IMPLANT
WATER STERILE IRR 500ML POUR (IV SOLUTION) ×2 IMPLANT

## 2023-10-08 NOTE — Transfer of Care (Signed)
 Immediate Anesthesia Transfer of Care Note  Patient: Zoe [name]  Procedure(s) Performed: CYSTOSCOPY, WITH RETROGRADE PYELOGRAM (Left: Ureter)  Patient Location: PACU  Anesthesia Type:General  Level of Consciousness: sedated  Airway & Oxygen Therapy: Patient Spontanous Breathing  Post-op Assessment: Report given to RN and Post -op Vital signs reviewed and stable  Post vital signs: Reviewed and stable  Last Vitals:  Vitals Value Taken Time  BP 127/78 10/08/23 15:31  Temp    Pulse 64 10/08/23 15:33  Resp 13 10/08/23 15:33  SpO2 99 % 10/08/23 15:33  Vitals shown include unfiled device data.  Last Pain:  Vitals:   10/08/23 1155  TempSrc: Temporal  PainSc: 0-No pain         Complications: No notable events documented.

## 2023-10-08 NOTE — Anesthesia Preprocedure Evaluation (Signed)
 Anesthesia Evaluation  Patient identified by MRN, date of birth, ID band Patient awake    Reviewed: Allergy & Precautions, NPO status , Patient's Chart, lab work & pertinent test results  History of Anesthesia Complications Negative for: history of anesthetic complications  Airway Mallampati: II  TM Distance: >3 FB Neck ROM: full    Dental no notable dental hx.    Pulmonary neg pulmonary ROS   Pulmonary exam normal        Cardiovascular hypertension, On Medications Normal cardiovascular exam     Neuro/Psych  PSYCHIATRIC DISORDERS  Depression    negative neurological ROS     GI/Hepatic negative GI ROS, Neg liver ROS,,,  Endo/Other  negative endocrine ROS    Renal/GU      Musculoskeletal   Abdominal   Peds  Hematology negative hematology ROS (+)   Anesthesia Other Findings Past Medical History: No date: Cataracts, bilateral No date: Depression No date: Diverticulosis 11/15/2019: Extrusion of scleral buckle     Comment:  Exposed stitch in the past superonasal quadrant,               resected No date: Glaucoma No date: Heart palpitations No date: History of kidney stones No date: Hypertension No date: Kidney stone No date: Lamellar macular hole of right eye No date: Primary open angle glaucoma of both eyes, moderate stage No date: Retinal detachment, right No date: Syncope     Comment:  Hx; of one episode No date: Umbilical hernia     Comment:  at birth  Past Surgical History: No date: CATARACT EXTRACTION W/ INTRAOCULAR LENS IMPLANT; Left 04/13/2013: CATARACT EXTRACTION W/PHACO; Right     Comment:  Procedure: RIGHT CATARACT EXTRACTION PHACO AND               INTRAOCULAR LENS PLACEMENT (IOC);  Surgeon: Gaither Quan,              MD;  Location: [facility] OR;  Service: Ophthalmology;                Laterality: Right; 01/06/2020: COLONOSCOPY No date: COLONOSCOPY     Comment:  2010, 2016 No date: CRYOABLATION      Comment:  of cervix 02/11/2022: CYSTOSCOPY W/ RETROGRADES     Comment:  Procedure: CYSTOSCOPY WITH RETROGRADE PYELOGRAM;                Surgeon: Twylla Glendia BROCKS, MD;  Location: [facility] ORS;                Service: Urology;; 02/11/2022: CYSTOSCOPY/URETEROSCOPY/HOLMIUM LASER/STENT PLACEMENT;  Right     Comment:  Procedure: CYSTOSCOPY/URETEROSCOPY/HOLMIUM LASER/STENT               PLACEMENT;  Surgeon: Twylla Glendia BROCKS, MD;  Location:               [facility] ORS;  Service: Urology;  Laterality: Right; No date: DILATION AND CURETTAGE OF UTERUS 2010: ESOPHAGOGASTRODUODENOSCOPY No date: KNEE ARTHROSCOPY     Comment:  Hx: of right knee 2015: RETINAL DETACHMENT SURGERY; Right     Comment:  x2 2010: SHOULDER ARTHROSCOPY W/ ROTATOR CUFF REPAIR; Left 2012: TUBAL LIGATION     Reproductive/Obstetrics negative OB ROS                              Anesthesia Physical Anesthesia Plan  ASA: 2  Anesthesia Plan:    Post-op Pain Management:    Induction: Intravenous  PONV Risk Score and Plan:   Airway Management Planned: LMA  Additional Equipment:   Intra-op Plan:   Post-operative Plan: Extubation in OR  Informed Consent: I have reviewed the patients History and Physical, chart, labs and discussed the procedure including the risks, benefits and alternatives for the proposed anesthesia with the patient or authorized representative who has indicated his/her understanding and acceptance.     Dental Advisory Given  Plan Discussed with: Anesthesiologist, CRNA and Surgeon  Anesthesia Plan Comments: (Patient consented for risks of anesthesia including but not limited to:  - adverse reactions to medications - damage to eyes, teeth, lips or other oral mucosa - nerve damage due to positioning  - sore throat or hoarseness - Damage to heart, brain, nerves, lungs, other parts of body or loss of life  Patient voiced understanding and assent.)        Anesthesia  Quick Evaluation

## 2023-10-08 NOTE — Interval H&P Note (Signed)
 History and Physical Interval Note:  10/08/2023 2:51 PM  Zoe [name]  has presented today for surgery, with the diagnosis of Left Hydronephrosis.  The various methods of treatment have been discussed with the patient and family. After consideration of risks, benefits and other options for treatment, the patient has consented to  Procedure(s): CYSTOSCOPY, WITH STENT INSERTION (Left) as a surgical intervention.  The patient's history has been reviewed, patient examined, no change in status, stable for surgery.  I have reviewed the patient's chart and labs.  Questions were answered to the patient's satisfaction.    CV: RRR Lungs: Clear  Breann Losano C [name]

## 2023-10-08 NOTE — H&P (Signed)
 Urology H&P    Assessment/Recommendations:  1.  Left hydronephrosis CT consistent with left UPJ stricture.  Presents for cystoscopy with placement left ureteral stent.  Procedure has been discussed in detail including potential risk of bleeding, infection.  All questions were answered and she desires to proceed   History of Present Illness: Zoe [name] is a 68 y.o. with a prior history of stone disease.  Recently seen by Clotilda Cornwall with UTI and pulling sensation left flank.  CT remarkable for moderate to severe left hydronephrosis with findings consistent with stricture at the UPJ.  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   Syncope    Hx; of one episode   Umbilical hernia    at birth    Past Surgical History:  Procedure Laterality Date   CATARACT EXTRACTION W/ INTRAOCULAR LENS IMPLANT Left    CATARACT EXTRACTION W/PHACO Right 04/13/2013   Procedure: RIGHT CATARACT EXTRACTION PHACO AND INTRAOCULAR LENS PLACEMENT (IOC);  Surgeon: Gaither Quan, MD;  Location: [facility];  Service: Ophthalmology;  Laterality: Right;   COLONOSCOPY  01/06/2020   COLONOSCOPY     2010, 2016   CRYOABLATION     of cervix   CYSTOSCOPY W/ RETROGRADES  02/11/2022   Procedure: CYSTOSCOPY WITH RETROGRADE PYELOGRAM;  Surgeon: Twylla Glendia BROCKS, MD;  Location: [facility] ORS;  Service: Urology;;   CYSTOSCOPY/URETEROSCOPY/HOLMIUM LASER/STENT  PLACEMENT Right 02/11/2022   Procedure: CYSTOSCOPY/URETEROSCOPY/HOLMIUM LASER/STENT PLACEMENT;  Surgeon: Twylla Glendia BROCKS, MD;  Location: [facility] ORS;  Service: Urology;  Laterality: Right;   CYSTOSCOPY/URETEROSCOPY/HOLMIUM LASER/STENT PLACEMENT Left 11/11/2022   Procedure: CYSTOSCOPY/URETEROSCOPY/HOLMIUM LASER/STENT PLACEMENT;  Surgeon: Twylla Glendia BROCKS, MD;  Location: [facility] ORS;  Service: Urology;  Laterality: Left;   DILATION AND CURETTAGE OF UTERUS     ESOPHAGOGASTRODUODENOSCOPY  2010   KNEE ARTHROSCOPY     Hx: of right knee   RETINAL DETACHMENT SURGERY Right 2015   x2   SHOULDER ARTHROSCOPY W/ ROTATOR CUFF REPAIR Left 2010   TUBAL LIGATION  2012    Home Medications:  Current Meds  Medication Sig   acetaminophen  (TYLENOL ) 500 MG tablet Take 500 mg by mouth every 6 (six) hours as needed for mild pain.   amLODipine (NORVASC) 5 MG tablet Take 5 mg by mouth daily.   brimonidine-timolol (COMBIGAN) 0.2-0.5 % ophthalmic solution Place 1 drop into both eyes every 12 (twelve) hours.   dorzolamide (TRUSOPT) 2 % ophthalmic solution Place 1 drop into both eyes 2 (two) times daily.   ergocalciferol (VITAMIN D2) 1.25 MG (50000 UT) capsule Take 50,000 Units by mouth once a week. Sunday   latanoprost (XALATAN) 0.005 % ophthalmic solution Place 1 drop into both eyes at bedtime.   losartan (COZAAR) 100 MG tablet Take 100 mg by mouth daily.   OVER THE COUNTER MEDICATION Take 1 tablet by mouth every other day. Neuriva    Allergies:  Allergies  Allergen Reactions   Other Other (See Comments)    [place] Nuts cause throat to itch     Family History  Problem Relation Age of Onset   Hypertension Mother    Diabetes Mother    Hypertension Father    Glaucoma Father    Cancer Other    Breast cancer Paternal Aunt    Breast cancer Paternal Aunt     Social History:  reports that she has never smoked. She has never used smokeless tobacco. She reports that she does not currently use alcohol. She reports  that she does not use drugs.  ROS: No fever, chills, chest pain, shortness of breath  Physical Exam:  Vital signs in last 24 hours: Temp:  [97.2 F (36.2 C)] 97.2 F (36.2 C) (08/07 1155) Pulse Rate:  [63] 63 (08/07 1155) Resp:  [17] 17 (08/07 1155) BP: (114)/(73) 114/73 (08/07 1155) SpO2:  [100 %] 100 % (08/07 1155) Weight:  [68 kg] 68 kg (08/07 1155) Constitutional:  Alert and oriented, No acute distress HEENT: [place] AT, moist mucus membranes.  Trachea midline Respiratory: Normal respiratory effort Psychiatric: Normal mood and affect     10/08/2023, 2:48 PM  Glendia Barba,  MD

## 2023-10-08 NOTE — Discharge Instructions (Signed)
 Cystoscopy patient instructions  You may pass blood clots in your urine, especially if you had a biopsy. It is not unusual to pass small blood clots and have some bloody urine a couple of weeks after your cystoscopy. Again, call your doctor if the bleeding does not subside. You may have: Dysuria (painful urination) Frequency (urinating often) Urgency (strong desire to urinate)  Avoiding alcohol and caffeine, such as coffee, tea, and chocolate, may help relieve these symptoms. Drink plenty of water , unless otherwise instructed. Your doctor may also prescribe an antibiotic or other medicine to reduce these symptoms.  Cystoscopy results are available soon after the procedure; biopsy results usually take two to four days. Your doctor will discuss the results of your exam with you. Before you go home, you will be given specific instructions for follow-up care. Special Instructions:    You may resume your normal activities.   Do not drive or operate machinery for 24 hours.   Be sure to keep all follow-up appointments with your doctor.   Call Your Doctor If: You have severe pain You are unable to urinate You have a fever over 101 You have severe bleeding

## 2023-10-08 NOTE — Op Note (Signed)
   Preoperative diagnosis:  Left UPJ stricture Left hydronephrosis secondary to above Left renal atrophy secondary to above  Postoperative diagnosis:  Left UPJ stricture-obliterative  Procedure: Cystoscopy with left retrograde pyelogram  Surgeon: Glendia JAYSON Barba, MD  Anesthesia: General  Complications: None  Intraoperative findings:  Cystoscopy: Bladder mucosa normal in appearance without erythema, solid or papillary lesions Left retrograde pyelogram.  Ureter normal in caliber without filling defect, stricture.  Narrowing of proximal ureter and no contrast seen in the renal pelvis  EBL: None  Specimens: None  Indication: Zoe [name] is a 68 y.o. patient with a history of recurrent stone disease and prior ureteroscopy.  Recent CT obtained for UTI showed moderate-severe left hydronephrosis to the UPJ and renal atrophy.  Renal scan showed differential function 14.5% L/85.5 R.  After reviewing the management options for treatment, she elected to proceed with the above surgical procedure(s). We have discussed the potential benefits and risks of the procedure, side effects of the proposed treatment, the likelihood of the patient achieving the goals of the procedure, and any potential problems that might occur during the procedure or recuperation. Informed consent has been obtained.  Description of procedure:  The patient was taken to the operating room and general anesthesia was induced.  The patient was placed in the dorsal lithotomy position, prepped and draped in the usual sterile fashion, and preoperative antibiotics were administered. A preoperative time-out was performed.   A 21 French cystoscope was lubricated and advanced per urethra.  Panendoscopy was performed with findings as described above.  Attention was directed to the left UO and a 0.038 Sensor wire was placed through the cystoscope and into the UO and advanced proximally under fluoroscopic guidance.  Resistance was  met in the region of the renal pelvis and the wire would not advance.  A 45F open-ended ureteral catheter was advanced over the guidewire to the proximal ureter.  Omnipaque  contrast was injected and no contrast was identified in the renal pelvis as described above.  The Sensor wire and a 0.035 ZIPwire placed through the ureteral catheter would not advance into the renal pelvis.  At this point the procedure was terminated.  The bladder was emptied and the cystoscope was removed.  Plan: Based on current renal function and lack of symptoms will discuss options of no treatment versus PCNL which would require nephrectomy if renal function did not improve   Glendia JAYSON Barba, M.D.

## 2023-10-09 ENCOUNTER — Encounter: Payer: Self-pay | Admitting: Urology

## 2023-10-09 NOTE — Anesthesia Postprocedure Evaluation (Signed)
 Anesthesia Post Note  Patient: ELZA VARRICCHIO  Procedure(s) Performed: CYSTOSCOPY, WITH RETROGRADE PYELOGRAM (Left: Ureter)  Patient location during evaluation: PACU Anesthesia Type: General Level of consciousness: awake and alert Pain management: pain level controlled Vital Signs Assessment: post-procedure vital signs reviewed and stable Respiratory status: spontaneous breathing, nonlabored ventilation, respiratory function stable and patient connected to nasal cannula oxygen Cardiovascular status: blood pressure returned to baseline and stable Postop Assessment: no apparent nausea or vomiting Anesthetic complications: no   No notable events documented.   Last Vitals:  Vitals:   10/08/23 1610 10/08/23 1613  BP:  (!) 168/77  Pulse: (!) 59 (!) 59  Resp: 15   Temp:  (!) 35.8 C  SpO2: 100% 100%    Last Pain:  Vitals:   10/08/23 1613  TempSrc: Temporal  PainSc: 0-No pain                 Debby Mines

## 2023-10-11 ENCOUNTER — Ambulatory Visit: Payer: Self-pay | Admitting: Urology

## 2023-11-17 ENCOUNTER — Encounter: Payer: Self-pay | Admitting: Urology

## 2023-11-17 ENCOUNTER — Ambulatory Visit (INDEPENDENT_AMBULATORY_CARE_PROVIDER_SITE_OTHER): Admitting: Urology

## 2023-11-17 VITALS — BP 146/83 | HR 82 | Ht 64.0 in | Wt 152.0 lb

## 2023-11-17 DIAGNOSIS — N261 Atrophy of kidney (terminal): Secondary | ICD-10-CM | POA: Diagnosis not present

## 2023-11-17 DIAGNOSIS — N131 Hydronephrosis with ureteral stricture, not elsewhere classified: Secondary | ICD-10-CM

## 2023-11-20 NOTE — Progress Notes (Signed)
 11/17/2023 5:20 PM   Zoe [name] [birth year] [id number]  Referring provider: Marikay Eva POUR, PA [address] [facility] [address] [facility] [place]  [place] [address]  Chief Complaint  Patient presents with   Nephrolithiasis   Urologic history:   1.  Recurrent nephrolithiasis Left ureteroscopic stone removal April 2015 CT 03/2019 bilateral, nonobstructing renal calculi L >R Metabolic evaluation 07/2019 low urine volume and hypocitraturia Ureteroscopic removal of a 12 mm right renal pelvic calculus 02/11/2022 (uncomplicated) Ureteroscopic removal of a 5 mm left renal pelvic calculus 11/11/2022 (uncomplicated)   HPI: Zoe [name] is a 68 y.o. female presents for postop follow-up.  PA follow-up 07/2023 for recurrent UTI and nephrolithiasis CT renal stone study 08/26/2023 remarkable for moderate-severe left hydronephrosis with renal cortical atrophy and narrowing at the UPJ Lasix  renogram with differential function L14.5%/ R85.5% with minimal renal cortical accumulation of radiotracer on the left and a faintly identified left kidney Left retrograde pyelogram 10/08/2023 with an obliterative stricture at the left UPJ No postoperative complaints   PMH: Past Medical History:  Diagnosis Date   Cataracts, bilateral    Depression    Diverticulosis    Extrusion of scleral buckle 11/15/2019   Exposed stitch in the past superonasal quadrant, resected   Glaucoma    Heart palpitations    History of kidney stones    Hypertension    Kidney stone    Lamellar macular hole of right eye    Lattice degeneration, 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   Microhematuria 02/18/2019   Primary open angle glaucoma of both eyes, moderate stage 05/02/2021   Retinal detachment, right    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   Syncope    Hx; of one episode   Umbilical hernia    at birth    Surgical History: Past Surgical History:  Procedure Laterality Date   CATARACT EXTRACTION W/ INTRAOCULAR  LENS IMPLANT Left    CATARACT EXTRACTION W/PHACO Right 04/13/2013   Procedure: RIGHT CATARACT EXTRACTION PHACO AND INTRAOCULAR LENS PLACEMENT (IOC);  Surgeon: Gaither Quan, MD;  Location: [facility] OR;  Service: Ophthalmology;  Laterality: Right;   COLONOSCOPY  01/06/2020   COLONOSCOPY     2010, 2016   CRYOABLATION     of cervix   CYSTOSCOPY W/ RETROGRADES  02/11/2022   Procedure: CYSTOSCOPY WITH RETROGRADE PYELOGRAM;  Surgeon: Twylla Glendia BROCKS, MD;  Location: [facility] ORS;  Service: Urology;;   PHYLLIS W/ RETROGRADES Left 10/08/2023   Procedure: CYSTOSCOPY, WITH RETROGRADE PYELOGRAM;  Surgeon: Twylla Glendia BROCKS, MD;  Location: [facility] ORS;  Service: Urology;  Laterality: Left;  unable to place stent   CYSTOSCOPY/URETEROSCOPY/HOLMIUM LASER/STENT PLACEMENT Right 02/11/2022   Procedure: CYSTOSCOPY/URETEROSCOPY/HOLMIUM LASER/STENT PLACEMENT;  Surgeon: Twylla Glendia BROCKS, MD;  Location: [facility] ORS;  Service: Urology;  Laterality: Right;   CYSTOSCOPY/URETEROSCOPY/HOLMIUM LASER/STENT PLACEMENT Left 11/11/2022   Procedure: CYSTOSCOPY/URETEROSCOPY/HOLMIUM LASER/STENT PLACEMENT;  Surgeon: Twylla Glendia BROCKS, MD;  Location: [facility] ORS;  Service: Urology;  Laterality: Left;   DILATION AND CURETTAGE OF UTERUS     ESOPHAGOGASTRODUODENOSCOPY  2010   KNEE ARTHROSCOPY     Hx: of right knee   RETINAL DETACHMENT SURGERY Right 2015   x2   SHOULDER ARTHROSCOPY W/ ROTATOR CUFF REPAIR Left 2010   TUBAL LIGATION  2012    Home Medications:  Allergies as of 11/17/2023       Reactions   Other Other (See Comments)   [place] Nuts cause throat to itch         Medication List  Accurate as of November 17, 2023 11:59 PM. If you have any questions, ask your nurse or doctor.          acetaminophen  500 MG tablet Commonly known as: TYLENOL  Take 500 mg by mouth every 6 (six) hours as needed for mild pain.   amLODipine 5 MG tablet Commonly known as: NORVASC Take 5 mg by mouth daily.   brimonidine-timolol 0.2-0.5 %  ophthalmic solution Commonly known as: COMBIGAN Place 1 drop into both eyes every 12 (twelve) hours.   dorzolamide 2 % ophthalmic solution Commonly known as: TRUSOPT Place 1 drop into both eyes 2 (two) times daily.   ergocalciferol 1.25 MG (50000 UT) capsule Commonly known as: VITAMIN D2 Take 50,000 Units by mouth once a week. Sunday   latanoprost 0.005 % ophthalmic solution Commonly known as: XALATAN Place 1 drop into both eyes at bedtime.   losartan 100 MG tablet Commonly known as: COZAAR Take 100 mg by mouth daily.   OVER THE COUNTER MEDICATION Take 1 tablet by mouth every other day. Neuriva        Allergies:  Allergies  Allergen Reactions   Other Other (See Comments)    [place] Nuts cause throat to itch     Family History: Family History  Problem Relation Age of Onset   Hypertension Mother    Diabetes Mother    Hypertension Father    Glaucoma Father    Cancer Other    Breast cancer Paternal Aunt    Breast cancer Paternal Aunt     Social History:  reports that she has never smoked. She has never used smokeless tobacco. She reports that she does not currently use alcohol. She reports that she does not use drugs.   Physical Exam: BP (!) 146/83   Pulse 82   Ht 5' 4 (1.626 m)   Wt 152 lb (68.9 kg)   BMI 26.09 kg/m   Constitutional:  Alert, No acute distress. HEENT: [place] AT Respiratory: Normal respiratory effort, no increased work of breathing. Psychiatric: Normal mood and affect.   Assessment & Plan:    1.  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 Cara at [facility], MD  [facility] [address], [address] [place], [place] [address] [phone]

## 2023-11-23 ENCOUNTER — Encounter: Payer: Self-pay | Admitting: Urology

## 2023-12-09 ENCOUNTER — Telehealth: Payer: Self-pay | Admitting: Urology

## 2023-12-09 DIAGNOSIS — N135 Crossing vessel and stricture of ureter without hydronephrosis: Secondary | ICD-10-CM

## 2023-12-09 NOTE — Telephone Encounter (Signed)
 Patient called requesting a referral be sent to Dr. Cara at [facility] as discussed at last appointment. Please advise.

## 2023-12-21 ENCOUNTER — Ambulatory Visit: Admitting: Dermatology

## 2024-03-15 ENCOUNTER — Ambulatory Visit: Admitting: Dermatology

## 2024-07-01 ENCOUNTER — Ambulatory Visit: Admitting: Urology

## 2024-10-03 ENCOUNTER — Ambulatory Visit: Admitting: Dermatology
